# Patient Record
Sex: Male | Born: 1990
Health system: Southern US, Community
[De-identification: ages and names within clinical notes are randomized; demographics above are authoritative.]

## PROBLEM LIST (undated history)

## (undated) DIAGNOSIS — R519 Headache, unspecified: Secondary | ICD-10-CM

## (undated) DIAGNOSIS — L2082 Flexural eczema: Secondary | ICD-10-CM

## (undated) DIAGNOSIS — B20 Human immunodeficiency virus [HIV] disease: Secondary | ICD-10-CM

## (undated) DIAGNOSIS — R21 Rash and other nonspecific skin eruption: Secondary | ICD-10-CM

## (undated) DIAGNOSIS — I10 Essential (primary) hypertension: Secondary | ICD-10-CM

## (undated) DIAGNOSIS — Z973 Presence of spectacles and contact lenses: Secondary | ICD-10-CM

## (undated) DIAGNOSIS — R51 Headache: Secondary | ICD-10-CM

## (undated) DIAGNOSIS — Z21 Asymptomatic human immunodeficiency virus [HIV] infection status: Secondary | ICD-10-CM

## (undated) DIAGNOSIS — L309 Dermatitis, unspecified: Secondary | ICD-10-CM

## (undated) HISTORY — DX: Dermatitis, unspecified: L30.9

## (undated) HISTORY — DX: Essential (primary) hypertension: I10

## (undated) HISTORY — DX: Human immunodeficiency virus (HIV) disease: B20

## (undated) HISTORY — PX: COLONOSCOPY: SHX174

## (undated) HISTORY — PX: WISDOM TOOTH EXTRACTION: SHX21

## (undated) HISTORY — DX: Rash and other nonspecific skin eruption: R21

## (undated) HISTORY — DX: Flexural eczema: L20.82

## (undated) HISTORY — DX: Asymptomatic human immunodeficiency virus (hiv) infection status: Z21

---
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
## 2008-12-20 ENCOUNTER — Encounter: Payer: Self-pay | Admitting: Infectious Diseases

## 2008-12-21 ENCOUNTER — Ambulatory Visit: Payer: Self-pay | Admitting: Internal Medicine

## 2008-12-21 DIAGNOSIS — B20 Human immunodeficiency virus [HIV] disease: Secondary | ICD-10-CM | POA: Insufficient documentation

## 2008-12-21 LAB — CONVERTED CEMR LAB
Alkaline Phosphatase: 58 units/L (ref 39–117)
BUN: 9 mg/dL (ref 6–23)
Basophils Absolute: 0.1 10*3/uL (ref 0.0–0.1)
Basophils Relative: 1 % (ref 0–1)
Bilirubin Urine: NEGATIVE
Chlamydia, Swab/Urine, PCR: NEGATIVE
Cholesterol: 74 mg/dL (ref 0–200)
Eosinophils Absolute: 0.4 10*3/uL (ref 0.0–0.7)
Eosinophils Relative: 5 % (ref 0–5)
Glucose, Bld: 77 mg/dL (ref 70–99)
HCT: 41.3 % (ref 39.0–52.0)
HDL: 29 mg/dL — ABNORMAL LOW (ref >39)
HIV-1 RNA Quant, Log: 5.64 — ABNORMAL HIGH (ref <1.68)
HIV-1 antibody: POSITIVE — AB
HIV-2 Ab: UNDETERMINED — AB
HIV: REACTIVE
Hemoglobin: 13 g/dL (ref 13.0–17.0)
Hep A Total Ab: NEGATIVE
Ketones, ur: NEGATIVE mg/dL
LDL Cholesterol: 36 mg/dL (ref 0–99)
MCHC: 31.5 g/dL (ref 30.0–36.0)
MCV: 84.5 fL (ref >=78.0)
Monocytes Absolute: 0.9 10*3/uL (ref 0.1–1.0)
Neutro Abs: 2.9 10*3/uL (ref 1.7–7.7)
RDW: 14.8 % (ref 11.5–15.5)
RPR Titer: 1:1 {titer}
Specific Gravity, Urine: 1.029 (ref 1.005–1.0)
T pallidum Antibodies (TP-PA): 17 — ABNORMAL HIGH (ref <1.0)
Total Bilirubin: 0.3 mg/dL (ref 0.3–1.2)
Triglycerides: 46 mg/dL (ref <150)
Urine Glucose: NEGATIVE mg/dL
Urobilinogen, UA: 1 (ref 0.0–1.0)
VLDL: 9 mg/dL (ref 0–40)

## 2009-01-05 ENCOUNTER — Encounter: Payer: Self-pay | Admitting: Infectious Disease

## 2009-01-07 ENCOUNTER — Ambulatory Visit: Payer: Self-pay | Admitting: Internal Medicine

## 2009-04-08 ENCOUNTER — Encounter: Payer: Self-pay | Admitting: Internal Medicine

## 2009-10-06 ENCOUNTER — Telehealth: Payer: Self-pay | Admitting: Internal Medicine

## 2009-11-02 ENCOUNTER — Telehealth: Payer: Self-pay | Admitting: Internal Medicine

## 2009-11-28 ENCOUNTER — Telehealth (INDEPENDENT_AMBULATORY_CARE_PROVIDER_SITE_OTHER): Payer: Self-pay | Admitting: *Deleted

## 2010-03-08 NOTE — Progress Notes (Signed)
Summary: ncadap meds arrived for sept-pt aware  Prescriptions: ATRIPLA 600-200-300 MG TABS (EFAVIRENZ-EMTRICITAB-TENOFOVIR) Take 1 tablet by mouth at bedtime  #30 x 0   Entered by:   Paulo Fruit  BS,CPht II,MPH   Authorized by:   Yisroel Ramming MD   Signed by:   Paulo Fruit  BS,CPht II,MPH on 11/02/2009   Method used:   Samples Given   RxID:   [id number]  Patient Assist Medication Verification: Medication name: Neva Seat # [id number] Tech approval:mld Call placed to patient with message that assistance medications are ready for pick-up. Paulo Fruit  BS,CPht II,MPH  November 02, 2009 8:36 AM  Appended Document: ncadap meds arrived for sept-pt aware tried to contact patient.  phone does not work. Need updated phone number.

## 2010-03-08 NOTE — Miscellaneous (Signed)
Summary: RW Update  Clinical Lists Changes  Observations: Added new observation of SYPHILIS TX: Yes (04/08/2009 11:43)

## 2010-03-08 NOTE — Progress Notes (Signed)
Summary: ADAP meds arrived, unable to notify pt., no working phone number  Phone Note Outgoing Call   Call placed by: Annice Pih Summary of Call: Tried to notify pt. that ADAP meds had arrived, but no working phone number for pt. Initial call taken by: Wendall Mola CMA Duncan Dull),  November 28, 2009 12:29 PM     Appended Document: ADAP meds arrived, unable to notify pt., no working phone number Pt picked up Atripla samples from ADAP. Tammy K King,RN III

## 2010-03-08 NOTE — Progress Notes (Signed)
Summary: ncadap med arrived for Aug--pt picked up  Phone Note Refill Request      Prescriptions: ATRIPLA 600-200-300 MG TABS (EFAVIRENZ-EMTRICITAB-TENOFOVIR) Take 1 tablet by mouth at bedtime  #30 x 0   Entered by:   Paulo Fruit  BS,CPht II,MPH   Authorized by:   Yisroel Ramming MD   Signed by:   Paulo Fruit  BS,CPht II,MPH on 10/06/2009   Method used:   Samples Given   RxID:   [id number]  Patient Assist Medication Verification: Medication name: Tyrone Nine RX # [id number] Tech approval:MLD  Prescription/Samples picked up by: patient Paulo Fruit  BS,CPht II,MPH  October 06, 2009 2:15 PM

## 2010-03-08 NOTE — Consult Note (Signed)
Summary: New Referral: [facility] Dept.  New Referral: [facility] Dept.   Imported By: Florinda Marker 02/23/2009 15:18:17  _____________________________________________________________________  External Attachment:    Type:   Image     Comment:   External Document

## 2010-03-25 ENCOUNTER — Encounter (INDEPENDENT_AMBULATORY_CARE_PROVIDER_SITE_OTHER): Payer: Self-pay | Admitting: *Deleted

## 2010-03-29 NOTE — Miscellaneous (Signed)
Summary: RW/ADAP Update  Clinical Lists Changes  Observations: Added new observation of FINASSESSDT: 09/21/2009 (03/25/2010 11:22) Added new observation of PAYOR: No Insurance (03/25/2010 11:22) Added new observation of AIDSDAP: Yes 2011 (03/25/2010 11:22) Added new observation of PCTFPL: 66.48  (03/25/2010 11:22) Added new observation of INCOMESOURCE: wages  (03/25/2010 11:22) Added new observation of HOUSEINCOME: 7200  (03/25/2010 11:22) Added new observation of #CHILD<18 IN: No  (03/25/2010 11:22) Added new observation of FAMILYSIZE: 1  (03/25/2010 11:22) Added new observation of HOUSING: Stable/permanent  (03/25/2010 11:22) Added new observation of YEARLYEXPEN: 0  (03/25/2010 11:22)

## 2010-04-05 ENCOUNTER — Ambulatory Visit (INDEPENDENT_AMBULATORY_CARE_PROVIDER_SITE_OTHER): Payer: Self-pay

## 2010-04-05 ENCOUNTER — Other Ambulatory Visit: Payer: Self-pay | Admitting: Adult Health

## 2010-04-05 ENCOUNTER — Encounter: Payer: Self-pay | Admitting: Adult Health

## 2010-04-05 DIAGNOSIS — B20 Human immunodeficiency virus [HIV] disease: Secondary | ICD-10-CM

## 2010-04-05 LAB — CONVERTED CEMR LAB
ALT: 15 units/L (ref 0–53)
AST: 16 units/L (ref 0–37)
Albumin: 4.5 g/dL (ref 3.5–5.2)
Bacteria, UA: NONE SEEN
Basophils Absolute: 0 10*3/uL (ref 0.0–0.1)
Basophils Relative: 0 % (ref 0–1)
Bilirubin Urine: NEGATIVE
Blood, UA: NEGATIVE
Calcium: 8.9 mg/dL (ref 8.4–10.5)
Chlamydia, Swab/Urine, PCR: NEGATIVE
Chloride: 97 meq/L (ref 96–112)
Crystals: NONE SEEN
Eosinophils Relative: 0 % (ref 0–5)
HCT: 39.8 % (ref 39.0–52.0)
Lymphocytes Relative: 11 % — ABNORMAL LOW (ref 12–46)
Platelets: 165 10*3/uL (ref 150–400)
RDW: 13 % (ref 11.5–15.5)
Sodium: 133 meq/L — ABNORMAL LOW (ref 135–145)
Specific Gravity, Urine: 1.02 (ref 1.005–1.030)
Urobilinogen, UA: 0.2 (ref 0.0–1.0)

## 2010-04-06 LAB — T-HELPER CELL (CD4) - (RCID CLINIC ONLY)
CD4 % Helper T Cell: 18 % — ABNORMAL LOW (ref 33–55)
CD4 T Cell Abs: 460 uL (ref 400–2700)

## 2010-04-07 ENCOUNTER — Emergency Department (HOSPITAL_COMMUNITY)
Admission: EM | Admit: 2010-04-07 | Discharge: 2010-04-07 | Disposition: A | Discharge status: Home / Self Care | Payer: Self-pay | Attending: Emergency Medicine | Admitting: Emergency Medicine

## 2010-04-07 DIAGNOSIS — K612 Anorectal abscess: Secondary | ICD-10-CM | POA: Insufficient documentation

## 2010-04-07 DIAGNOSIS — Z79899 Other long term (current) drug therapy: Secondary | ICD-10-CM | POA: Insufficient documentation

## 2010-04-07 DIAGNOSIS — R350 Frequency of micturition: Secondary | ICD-10-CM | POA: Insufficient documentation

## 2010-04-07 DIAGNOSIS — R197 Diarrhea, unspecified: Secondary | ICD-10-CM | POA: Insufficient documentation

## 2010-04-07 DIAGNOSIS — K6289 Other specified diseases of anus and rectum: Secondary | ICD-10-CM | POA: Insufficient documentation

## 2010-04-07 DIAGNOSIS — Z21 Asymptomatic human immunodeficiency virus [HIV] infection status: Secondary | ICD-10-CM | POA: Insufficient documentation

## 2010-04-07 LAB — URINALYSIS, ROUTINE W REFLEX MICROSCOPIC
Ketones, ur: 40 mg/dL — AB
Nitrite: NEGATIVE
Protein, ur: 100 mg/dL — AB
Specific Gravity, Urine: 1.026 (ref 1.005–1.030)
Urobilinogen, UA: 1 mg/dL (ref 0.0–1.0)

## 2010-04-07 LAB — URINE MICROSCOPIC-ADD ON

## 2010-04-08 LAB — URINE CULTURE
Colony Count: NO GROWTH
Culture  Setup Time: 201203021100

## 2010-04-18 NOTE — Assessment & Plan Note (Signed)
Summary: LABS   Allergies: No Known Drug Allergies

## 2010-04-20 ENCOUNTER — Ambulatory Visit: Payer: Self-pay | Admitting: Adult Health

## 2010-04-20 ENCOUNTER — Encounter: Payer: Self-pay | Admitting: Adult Health

## 2010-04-21 ENCOUNTER — Encounter (INDEPENDENT_AMBULATORY_CARE_PROVIDER_SITE_OTHER): Payer: Self-pay | Admitting: *Deleted

## 2010-04-25 NOTE — Miscellaneous (Signed)
Summary: ADAP UPDATE   Clinical Lists Changes  Observations: Added new observation of AIDSDAP: Pending-APPROVAL 2012 (04/21/2010 10:26) Added new observation of PCTFPL: 36.91  (04/21/2010 10:26) Added new observation of HOUSEINCOME: 3997  (04/21/2010 10:26) Added new observation of FINASSESSDT: 04/20/2010  (04/21/2010 10:26)

## 2010-05-01 ENCOUNTER — Encounter: Payer: Self-pay | Admitting: Adult Health

## 2010-05-01 ENCOUNTER — Ambulatory Visit (INDEPENDENT_AMBULATORY_CARE_PROVIDER_SITE_OTHER): Payer: Self-pay | Admitting: Adult Health

## 2010-05-01 VITALS — BP 108/72 | HR 128 | Temp 102.5°F | Ht 67.0 in | Wt 144.0 lb

## 2010-05-01 DIAGNOSIS — R509 Fever, unspecified: Secondary | ICD-10-CM

## 2010-05-01 DIAGNOSIS — K612 Anorectal abscess: Secondary | ICD-10-CM

## 2010-05-01 MED ORDER — DOXYCYCLINE HYCLATE 100 MG PO CAPS: 100.0000 mg | ORAL_CAPSULE | Freq: Two times a day (BID) | ORAL | Status: AC

## 2010-05-01 MED ORDER — CIPROFLOXACIN HCL 500 MG PO TABS: 500.0000 mg | ORAL_TABLET | Freq: Two times a day (BID) | ORAL | Status: AC

## 2010-05-01 MED ORDER — HYDROCODONE-IBUPROFEN 7.5-200 MG PO TABS: 1.0000 | ORAL_TABLET | ORAL | Status: AC | PRN

## 2010-05-01 NOTE — Progress Notes (Signed)
  Subjective:    Patient ID: Patrick Reilly, male    DOB: 1990-04-08, 20 y.o.   MRN: [id number]  HPI Pain, swelling, redness and fever developed to right buttock since Saturday 03/24.  Pain increasing with fevers over the past 24 hours.  Unable to sit due to pain    Review of Systems  Constitutional: Positive for fever, chills, activity change and fatigue. Negative for diaphoresis, appetite change and unexpected weight change.  HENT: Negative.   Eyes: Negative.   Respiratory: Negative.   Cardiovascular: Negative.   Gastrointestinal: Positive for constipation and rectal pain. Negative for vomiting, abdominal pain, diarrhea, blood in stool, abdominal distention and anal bleeding.  Genitourinary: Negative.   Musculoskeletal: Negative.   Skin: Negative.   Neurological: Negative.   Hematological: Negative.   Psychiatric/Behavioral: Negative.        Objective:   Physical Exam  Constitutional: He appears well-developed and well-nourished. He appears distressed.  HENT:  Head: Normocephalic and atraumatic.  Eyes: Conjunctivae and EOM are normal. Pupils are equal, round, and reactive to light.  Neck: Normal range of motion. Neck supple.  Cardiovascular: Regular rhythm and normal heart sounds.  Tachycardia present.   Pulmonary/Chest: Effort normal and breath sounds normal.  Abdominal: He exhibits no distension and no mass. There is no tenderness. There is no rebound and no guarding.  Skin: He is not diaphoretic.          Large, inflamed, fluctuant tender mass-effect to superior portion of right gluteal fold. No head formation noted.          Assessment & Plan:  Peri-rectal Abscess:  I&D of abscess, Cipro & Doxycycline antibiotic coverage for 10 days, Stool softner, Vicoprofen 7.5/200 q 4-6 h PRN, Dressing change bid.   RTC Wednesday for wound eval.   PROCEDURE NOTE:  Procedure explained in detail to pt. With discussion of risks and benefits outlined.  Area was prepped in medically  aseptic technique using sterile Betadine and sterile NS rinse,  A small 2 cm-sized area was anesthetized using 1% lidocaine, 25 gauge needle to area just adjacent to fluctuant abscessed region of right superior gluteal fold.  Using #15 scalpel a 0.5 cm incision was at site of infiltration.  Overt amount of sero-sanguinous fluid drained from wound, but no purulent matter was noted even after compressing wound.  Procedure was stopped due to patient discomfort.  The area was again cleansed with sterile Betadine and packed with sterile 4x4 gauze pads and ABD pad.  He was instructed to change dressing at least twice daily, and after bathing.    Instructed on diluted Betadine sitz baths 2-3 x/day, should remain off work until he is re-examined on Wednesday.  If wound does not spontaneously drain by then, we will refer to [facility] for further evaluation and intervention.  Verbally acknowledged this and agreed with plan.

## 2010-05-02 ENCOUNTER — Telehealth: Payer: Self-pay | Admitting: *Deleted

## 2010-05-02 MED ORDER — ACETAMINOPHEN 325 MG PO TABS: 650.0000 mg | ORAL_TABLET | ORAL | Status: AC | PRN

## 2010-05-02 NOTE — Telephone Encounter (Signed)
Pam from [employer] called [phone]) asking about his out of work note. The one she was given has the letterhead ripped off & did not give a specific date to return. Explained he is coming in tomorrow & will get another note. She asked that it be intact so that it includes letterhead.   Must have specific dates on it

## 2010-05-03 ENCOUNTER — Ambulatory Visit (INDEPENDENT_AMBULATORY_CARE_PROVIDER_SITE_OTHER): Payer: Self-pay | Admitting: Adult Health

## 2010-05-03 VITALS — BP 117/77 | HR 93 | Temp 98.1°F | Ht 67.0 in | Wt 139.8 lb

## 2010-05-03 DIAGNOSIS — B20 Human immunodeficiency virus [HIV] disease: Secondary | ICD-10-CM

## 2010-05-04 NOTE — Assessment & Plan Note (Signed)
Summary: 2wk f/u/vs   Vital Signs:  Patient profile:   20 year old male Height:      67 inches (170.18 cm) Weight:      148.0 pounds (67.27 kg) BMI:     23.26 Temp:     97.9 degrees F (36.61 degrees C) oral Pulse rate:   78 / minute BP sitting:   144 / 90  (right arm)  Vitals Entered By: Wendall Mola CMA Duncan Dull) (April 20, 2010 10:29 AM) CC: new pt., lab results Is Patient Diabetic? No Pain Assessment Patient in pain? no      Nutritional Status BMI of 19 -24 = normal Nutritional Status Detail appetite "good"  Have you ever been in a relationship where you felt threatened, hurt or afraid?No   Does patient need assistance? Functional Status Self care Ambulation Normal Comments pt. missed about 3 doses of Atripla    CC:  new pt. and lab results.  Preventive Screening-Counseling & Management  Alcohol-Tobacco     Alcohol drinks/day: 0     Smoking Status: quit < 6 months     Packs/Day: <0.25  Caffeine-Diet-Exercise     Caffeine use/day: sodas sometimes     Does Patient Exercise: yes     Type of exercise: dancer     Exercise (avg: min/session): >60     Times/week: <3  Safety-Violence-Falls     Seat Belt Use: yes      Drug Use:  Yes marijuana.    Comments: pt. declined condoms  Allergies (verified): No Known Drug Allergies  Social History: Drug Use:  Yes marijuana   Other Orders: Influenza Vaccine NON MCR (16109)   Orders Added: 1)  Influenza Vaccine NON MCR [00028]   Immunizations Administered:  Influenza Vaccine # 1:    Vaccine Type: Fluvax Non-MCR    Site: left deltoid    Mfr: Merck    Dose: 0.5 ml    Route: IM    Given by: Wendall Mola CMA ( AAMA)    Exp. Date: 08/05/2010    Lot #: [id number]    VIS given: 08/30/09 version given April 20, 2010.  Flu Vaccine Consent Questions:    Do you have a history of severe allergic reactions to this vaccine? no    Any prior history of allergic reactions to egg and/or gelatin? no  Do you have a sensitivity to the preservative Thimersol? no    Do you have a past history of Guillan-Barre Syndrome? no    Do you currently have an acute febrile illness? no    Have you ever had a severe reaction to latex? no    Vaccine information given and explained to patient? yes   Immunizations Administered:  Influenza Vaccine # 1:    Vaccine Type: Fluvax Non-MCR    Site: left deltoid    Mfr: Merck    Dose: 0.5 ml    Route: IM    Given by: Wendall Mola CMA ( AAMA)    Exp. Date: 08/05/2010    Lot #: [id number]    VIS given: 08/30/09 version given April 20, 2010.

## 2010-05-09 NOTE — Letter (Signed)
Summary: Out of Work  [facility]  [address]   [place], [place] [address]   Phone: [phone]  Fax: [phone]    May 01, 2010   Employee:  Patrick Reilly    To Whom It May Concern:   For Medical reasons, please excuse the above named employee from work for the following dates:  Start:    End:    If you need additional information, please feel free to contact our office.         Sincerely,    Alesia Morin CMA

## 2010-05-09 NOTE — Letter (Signed)
Summary: Out of Work  [facility]  [address]   [place], [place] [address]   Phone: [phone]  Fax: [phone]        May 03, 2010   Employee:  Patrick Reilly    To Whom It May Concern:   For Medical reasons, please excuse the above named employee from work for the following dates:  Start:   Monday May 01, 2010  End:   Wednesday May 03, 2010  Patient is able to return to work on Thursday, March 29 to full duty. If you need additional information, please feel free to contact our office.         Sincerely,    Talmadge Chad, N.P. Alesia Morin CMA

## 2010-05-10 LAB — T-HELPER CELL (CD4) - (RCID CLINIC ONLY): CD4 T Cell Abs: 480 uL (ref 400–2700)

## 2010-07-13 ENCOUNTER — Other Ambulatory Visit: Payer: Self-pay | Admitting: Adult Health

## 2010-07-13 ENCOUNTER — Other Ambulatory Visit: Payer: Self-pay

## 2010-07-13 DIAGNOSIS — Z113 Encounter for screening for infections with a predominantly sexual mode of transmission: Secondary | ICD-10-CM

## 2010-07-13 DIAGNOSIS — B20 Human immunodeficiency virus [HIV] disease: Secondary | ICD-10-CM

## 2010-07-13 DIAGNOSIS — Z79899 Other long term (current) drug therapy: Secondary | ICD-10-CM

## 2010-07-27 ENCOUNTER — Ambulatory Visit: Payer: Self-pay | Admitting: Adult Health

## 2010-09-04 ENCOUNTER — Other Ambulatory Visit: Payer: Self-pay | Admitting: Licensed Clinical Social Worker

## 2010-09-04 DIAGNOSIS — B20 Human immunodeficiency virus [HIV] disease: Secondary | ICD-10-CM

## 2010-09-04 MED ORDER — EFAVIRENZ-EMTRICITAB-TENOFOVIR 600-200-300 MG PO TABS: 1.0000 | ORAL_TABLET | Freq: Every day | ORAL | Status: DC

## 2010-11-14 LAB — CBC
HCT: 46.5
Hemoglobin: 15.5
RBC: 5.83 — ABNORMAL HIGH
RDW: 13.7

## 2010-11-14 LAB — URINALYSIS, ROUTINE W REFLEX MICROSCOPIC
Bilirubin Urine: NEGATIVE
Leukocytes, UA: NEGATIVE
Nitrite: NEGATIVE
Specific Gravity, Urine: 1.031 — ABNORMAL HIGH
Urobilinogen, UA: 0.2
pH: 5.5

## 2010-11-14 LAB — DIFFERENTIAL
Basophils Absolute: 0
Basophils Relative: 0
Eosinophils Absolute: 0
Neutro Abs: 5.6
Neutrophils Relative %: 84 — ABNORMAL HIGH

## 2010-11-14 LAB — COMPREHENSIVE METABOLIC PANEL
ALT: 16
Alkaline Phosphatase: 49
BUN: 15
CO2: 25
GFR calc non Af Amer: >60
Glucose, Bld: 109 — ABNORMAL HIGH
Potassium: 3.5
Sodium: 133 — ABNORMAL LOW
Total Bilirubin: 1

## 2010-11-14 LAB — STOOL CULTURE

## 2010-11-14 LAB — CLOSTRIDIUM DIFFICILE EIA: C difficile Toxins A+B, EIA: NEGATIVE

## 2010-11-14 LAB — OVA AND PARASITE EXAMINATION

## 2010-11-14 LAB — URINE MICROSCOPIC-ADD ON

## 2010-11-29 ENCOUNTER — Other Ambulatory Visit (INDEPENDENT_AMBULATORY_CARE_PROVIDER_SITE_OTHER): Payer: Self-pay

## 2010-11-29 ENCOUNTER — Other Ambulatory Visit: Payer: Self-pay | Admitting: *Deleted

## 2010-11-29 ENCOUNTER — Other Ambulatory Visit: Payer: Self-pay | Admitting: Infectious Diseases

## 2010-11-29 ENCOUNTER — Ambulatory Visit: Payer: Self-pay

## 2010-11-29 DIAGNOSIS — B20 Human immunodeficiency virus [HIV] disease: Secondary | ICD-10-CM

## 2010-11-29 DIAGNOSIS — Z113 Encounter for screening for infections with a predominantly sexual mode of transmission: Secondary | ICD-10-CM

## 2010-11-29 DIAGNOSIS — Z79899 Other long term (current) drug therapy: Secondary | ICD-10-CM

## 2010-11-29 LAB — CBC WITH DIFFERENTIAL/PLATELET
Eosinophils Absolute: 0.1 10*3/uL (ref 0.0–0.7)
Hemoglobin: 12.7 g/dL — ABNORMAL LOW (ref 13.0–17.0)
Lymphocytes Relative: 63 % — ABNORMAL HIGH (ref 12–46)
Lymphs Abs: 2.9 10*3/uL (ref 0.7–4.0)
MCH: 26.6 pg (ref 26.0–34.0)
Neutro Abs: 1.2 10*3/uL — ABNORMAL LOW (ref 1.7–7.7)
Neutrophils Relative %: 26 % — ABNORMAL LOW (ref 43–77)
Platelets: 207 10*3/uL (ref 150–400)
RBC: 4.77 MIL/uL (ref 4.22–5.81)
WBC: 4.5 10*3/uL (ref 4.0–10.5)

## 2010-11-29 LAB — COMPLETE METABOLIC PANEL WITH GFR
ALT: 12 U/L (ref 0–53)
Albumin: 4.3 g/dL (ref 3.5–5.2)
CO2: 25 mEq/L (ref 19–32)
GFR, Est African American: >90 mL/min (ref >90)
Potassium: 4.4 mEq/L (ref 3.5–5.3)
Sodium: 139 mEq/L (ref 135–145)
Total Bilirubin: 0.3 mg/dL (ref 0.3–1.2)
Total Protein: 7.6 g/dL (ref 6.0–8.3)

## 2010-11-29 LAB — URINALYSIS, MICROSCOPIC ONLY
Casts: NONE SEEN
Crystals: NONE SEEN
Squamous Epithelial / LPF: NONE SEEN

## 2010-11-29 LAB — URINALYSIS, ROUTINE W REFLEX MICROSCOPIC
Bilirubin Urine: NEGATIVE
Ketones, ur: NEGATIVE mg/dL
Specific Gravity, Urine: 1.015 (ref 1.005–1.030)
pH: 7.5 (ref 5.0–8.0)

## 2010-11-29 LAB — LIPID PANEL
Cholesterol: 113 mg/dL (ref 0–200)
Total CHOL/HDL Ratio: 2.1 Ratio
Triglycerides: 43 mg/dL (ref <150)
VLDL: 9 mg/dL (ref 0–40)

## 2010-11-29 MED ORDER — EFAVIRENZ-EMTRICITAB-TENOFOVIR 600-200-300 MG PO TABS: 1.0000 | ORAL_TABLET | Freq: Every day | ORAL | Status: DC

## 2010-11-29 NOTE — Telephone Encounter (Signed)
Patient needs Rx to go with ADAP application.

## 2010-11-30 LAB — T-HELPER CELL (CD4) - (RCID CLINIC ONLY): CD4 % Helper T Cell: 22 % — ABNORMAL LOW (ref 33–55)

## 2010-12-20 ENCOUNTER — Ambulatory Visit: Payer: Self-pay | Admitting: Infectious Disease

## 2011-03-08 NOTE — Assessment & Plan Note (Addendum)
Clinically stable on current regimen. Continue present management.  Counseling provided on prevention of transmission of HIV. Condoms offered:  Yes Medication adherence discussed with patient.  Follow up visit in 6 months with labs 2 weeks prior to appointment. Patient acknowledged information provided to them and agreed with plan of care.

## 2011-03-08 NOTE — Progress Notes (Signed)
Subjective:    Patient ID: Patrick Reilly is a 21 y.o. male.  Chief Complaint: HIV Follow-up Visit Patrick Reilly is here for follow-up of HIV infection. He is feeling unchanged since his last visit.  He claims continued adherence to therapy with good tolerance and no complications. There are not additional complaints.   Data Review: Diagnostic studies reviewed.  Review of Systems - General ROS: negative for - chills, fatigue, fever, hot flashes or malaise Psychological ROS: negative for - anxiety, behavioral disorder, decreased libido, depression or mood swings Ophthalmic ROS: negative for - blurry vision, decreased vision or double vision Respiratory ROS: no cough, shortness of breath, or wheezing Cardiovascular ROS: no chest pain or dyspnea on exertion Gastrointestinal ROS: no abdominal pain, change in bowel habits, or black or bloody stools Neurological ROS: no TIA or stroke symptoms Dermatological ROS: negative for rash and skin lesion changes  Objective:  General appearance: alert, cooperative, appears stated age and no distress Head: Normocephalic, without obvious abnormality, atraumatic Neck: no adenopathy, no carotid bruit, no JVD, supple, symmetrical, trachea midline and thyroid not enlarged, symmetric, no tenderness/mass/nodules Resp: clear to auscultation bilaterally Cardio: regular rate and rhythm, S1, S2 normal, no murmur, click, rub or gallop GI: soft, non-tender; bowel sounds normal; no masses,  no organomegaly Extremities: extremities normal, atraumatic, no cyanosis or edema Neurologic: Alert and oriented X 3, normal strength and tone. Normal symmetric reflexes. Normal coordination and gait Skin:  No active lesions or rashes noted.    Psych:  No vegetative signs or delusional behaviors noted.    Laboratory: From 04/05/10 ,  CD4 count was 460 c/cmm @ 18%. Viral load <20 copies/ml.     Assessment/Plan:   Human immunodeficiency virus (HIV) disease Clinically stable  on current regimen. Continue present management.  Counseling provided on prevention of transmission of HIV. Condoms offered:  Yes Medication adherence discussed with patient.  Follow up visit in 3 months with labs 2 weeks prior to appointment. Patient acknowledged information provided to them and agreed with plan of care.        Patrick Reilly A. Sundra Aland, MS, [facility] [phone]  03/08/2011, 3:46 PM

## 2011-04-18 ENCOUNTER — Other Ambulatory Visit (INDEPENDENT_AMBULATORY_CARE_PROVIDER_SITE_OTHER): Payer: Self-pay

## 2011-04-18 DIAGNOSIS — Z79899 Other long term (current) drug therapy: Secondary | ICD-10-CM

## 2011-04-18 DIAGNOSIS — B20 Human immunodeficiency virus [HIV] disease: Secondary | ICD-10-CM

## 2011-04-18 DIAGNOSIS — Z113 Encounter for screening for infections with a predominantly sexual mode of transmission: Secondary | ICD-10-CM

## 2011-04-18 LAB — CBC WITH DIFFERENTIAL/PLATELET
Basophils Absolute: 0 10*3/uL (ref 0.0–0.1)
Eosinophils Absolute: 0.1 10*3/uL (ref 0.0–0.7)
Eosinophils Relative: 2 % (ref 0–5)
Lymphs Abs: 3.3 10*3/uL (ref 0.7–4.0)
MCH: 27.1 pg (ref 26.0–34.0)
MCHC: 32.5 g/dL (ref 30.0–36.0)
MCV: 83.5 fL (ref 78.0–100.0)
Platelets: 204 10*3/uL (ref 150–400)
RDW: 14.7 % (ref 11.5–15.5)

## 2011-04-18 LAB — COMPLETE METABOLIC PANEL WITH GFR
ALT: 17 U/L (ref 0–53)
CO2: 26 mEq/L (ref 19–32)
Creat: 0.91 mg/dL (ref 0.50–1.35)
GFR, Est African American: >89 mL/min
Total Bilirubin: 0.2 mg/dL — ABNORMAL LOW (ref 0.3–1.2)

## 2011-04-18 LAB — URINALYSIS, ROUTINE W REFLEX MICROSCOPIC
Leukocytes, UA: NEGATIVE
Nitrite: NEGATIVE
Specific Gravity, Urine: 1.029 (ref 1.005–1.030)
Urobilinogen, UA: 1 mg/dL (ref 0.0–1.0)

## 2011-04-18 LAB — LIPID PANEL
Cholesterol: 103 mg/dL (ref 0–200)
Total CHOL/HDL Ratio: 2.4 Ratio

## 2011-04-19 LAB — T-HELPER CELL (CD4) - (RCID CLINIC ONLY)
CD4 % Helper T Cell: 22 % — ABNORMAL LOW (ref 33–55)
CD4 T Cell Abs: 720 uL (ref 400–2700)

## 2011-04-19 LAB — GC/CHLAMYDIA PROBE AMP, URINE
Chlamydia, Swab/Urine, PCR: NEGATIVE
GC Probe Amp, Urine: NEGATIVE

## 2011-04-20 LAB — HIV-1 RNA QUANT-NO REFLEX-BLD: HIV-1 RNA Quant, Log: <1.3 {Log} (ref <1.30)

## 2011-05-01 ENCOUNTER — Telehealth: Payer: Self-pay

## 2011-05-01 NOTE — Telephone Encounter (Signed)
Several attempts made by Patrick Reilly at [facility] to renew patient's adap/rw - has no shown or not gotten back.

## 2011-05-02 ENCOUNTER — Encounter: Payer: Self-pay | Admitting: Infectious Disease

## 2011-05-02 ENCOUNTER — Ambulatory Visit (INDEPENDENT_AMBULATORY_CARE_PROVIDER_SITE_OTHER): Payer: Self-pay | Admitting: Infectious Disease

## 2011-05-02 VITALS — BP 138/73 | HR 76 | Temp 98.2°F | Wt 164.0 lb

## 2011-05-02 DIAGNOSIS — Z23 Encounter for immunization: Secondary | ICD-10-CM

## 2011-05-02 DIAGNOSIS — F1290 Cannabis use, unspecified, uncomplicated: Secondary | ICD-10-CM | POA: Insufficient documentation

## 2011-05-02 DIAGNOSIS — B20 Human immunodeficiency virus [HIV] disease: Secondary | ICD-10-CM

## 2011-05-02 DIAGNOSIS — F329 Major depressive disorder, single episode, unspecified: Secondary | ICD-10-CM

## 2011-05-02 DIAGNOSIS — F1210 Cannabis abuse, uncomplicated: Secondary | ICD-10-CM

## 2011-05-02 DIAGNOSIS — Z7289 Other problems related to lifestyle: Secondary | ICD-10-CM

## 2011-05-02 NOTE — Assessment & Plan Note (Signed)
No need for intervention.

## 2011-05-02 NOTE — Progress Notes (Signed)
  Subjective:    Patient ID: Patrick Reilly, male    DOB: 03-01-1990, 21 y.o.   MRN: [id number]  HPI  21 year old [place] american man  With HIV superbly well controlled on atripla although he is missing 2 doses of it a week when he drinks etoh. I emphasized to pt low barrier to genetic resistance of this drug and need to tighten up his adherence. He is othwerwise doing relatively well with occasional depression, but not feeling need for SSRI or counselling. He does smoke marijuana but no tobacco.   Review of Systems  Constitutional: Negative for fever, chills, diaphoresis, activity change, appetite change, fatigue and unexpected weight change.  HENT: Negative for congestion, sore throat, rhinorrhea, sneezing, trouble swallowing and sinus pressure.   Eyes: Negative for photophobia and visual disturbance.  Respiratory: Negative for cough, chest tightness, shortness of breath, wheezing and stridor.   Cardiovascular: Negative for chest pain, palpitations and leg swelling.  Gastrointestinal: Negative for nausea, vomiting, abdominal pain, diarrhea, constipation, blood in stool, abdominal distention and anal bleeding.  Genitourinary: Negative for dysuria, hematuria, flank pain and difficulty urinating.  Musculoskeletal: Negative for myalgias, back pain, joint swelling, arthralgias and gait problem.  Skin: Negative for color change, pallor, rash and wound.  Neurological: Negative for dizziness, tremors, weakness and light-headedness.  Hematological: Negative for adenopathy. Does not bruise/bleed easily.  Psychiatric/Behavioral: Negative for behavioral problems, confusion, sleep disturbance, dysphoric mood, decreased concentration and agitation.       Objective:   Physical Exam  Constitutional: He is oriented to person, place, and time. He appears well-developed and well-nourished. No distress.  HENT:  Head: Normocephalic and atraumatic.  Mouth/Throat: Oropharynx is clear and moist. No oropharyngeal  exudate.  Eyes: Conjunctivae and EOM are normal. Pupils are equal, round, and reactive to light. No scleral icterus.  Neck: Normal range of motion. Neck supple. No JVD present.  Cardiovascular: Normal rate, regular rhythm and normal heart sounds.  Exam reveals no gallop and no friction rub.   No murmur heard. Pulmonary/Chest: Effort normal and breath sounds normal. No respiratory distress. He has no wheezes. He has no rales. He exhibits no tenderness.  Abdominal: He exhibits no distension and no mass. There is no tenderness. There is no rebound and no guarding.  Musculoskeletal: He exhibits no edema and no tenderness.  Lymphadenopathy:    He has no cervical adenopathy.  Neurological: He is alert and oriented to person, place, and time. He has normal reflexes. He exhibits normal muscle tone. Coordination normal.  Skin: Skin is warm and dry. He is not diaphoretic. No erythema. No pallor.  Psychiatric: He has a normal mood and affect. His behavior is normal. Judgment and thought content normal.          Assessment & Plan:  Human immunodeficiency virus (HIV) disease Perfect control but tighten up adherence. Hep A vax since MSM  Depression No need for intervention  Marijuana use Not interfering with adherence of function. No great concerns at this time  Alcohol use Largely on weekends and small quantities. Asked to PLEASE contineu to take atripla on weekends too. Only issue iss if he becomes dehydrated over more than one day then concern for TNF toxicity risk

## 2011-05-02 NOTE — Assessment & Plan Note (Signed)
Perfect control but tighten up adherence. Hep A vax since MSM

## 2011-05-02 NOTE — Assessment & Plan Note (Signed)
Not interfering with adherence of function. No great concerns at this time

## 2011-05-02 NOTE — Progress Notes (Signed)
Addended by: Starleen Arms D on: 05/02/2011 11:22 AM   Modules accepted: Orders

## 2011-05-02 NOTE — Assessment & Plan Note (Signed)
Largely on weekends and small quantities. Asked to PLEASE contineu to take atripla on weekends too. Only issue iss if he becomes dehydrated over more than one day then concern for TNF toxicity risk

## 2011-06-04 ENCOUNTER — Other Ambulatory Visit: Payer: Self-pay | Admitting: Infectious Diseases

## 2011-06-04 ENCOUNTER — Encounter: Payer: Self-pay | Admitting: *Deleted

## 2011-06-04 ENCOUNTER — Other Ambulatory Visit: Payer: Self-pay | Admitting: *Deleted

## 2011-06-04 ENCOUNTER — Encounter: Payer: Self-pay | Admitting: Infectious Diseases

## 2011-06-04 ENCOUNTER — Ambulatory Visit (INDEPENDENT_AMBULATORY_CARE_PROVIDER_SITE_OTHER): Payer: Self-pay | Admitting: Infectious Diseases

## 2011-06-04 VITALS — BP 135/79 | HR 137 | Temp 98.8°F | Ht 66.0 in | Wt 158.4 lb

## 2011-06-04 DIAGNOSIS — L0231 Cutaneous abscess of buttock: Secondary | ICD-10-CM

## 2011-06-04 DIAGNOSIS — A630 Anogenital (venereal) warts: Secondary | ICD-10-CM | POA: Insufficient documentation

## 2011-06-04 DIAGNOSIS — B20 Human immunodeficiency virus [HIV] disease: Secondary | ICD-10-CM

## 2011-06-04 MED ORDER — HYDROCODONE-ACETAMINOPHEN 5-500 MG PO TABS: 1.0000 | ORAL_TABLET | Freq: Four times a day (QID) | ORAL | Status: AC | PRN

## 2011-06-04 MED ORDER — SULFAMETHOXAZOLE-TRIMETHOPRIM 400-80 MG PO TABS: 1.0000 | ORAL_TABLET | Freq: Two times a day (BID) | ORAL | Status: DC

## 2011-06-04 MED ORDER — SULFAMETHOXAZOLE-TRIMETHOPRIM 400-80 MG PO TABS: 1.0000 | ORAL_TABLET | Freq: Two times a day (BID) | ORAL | Status: AC

## 2011-06-04 MED ORDER — EFAVIRENZ-EMTRICITAB-TENOFOVIR 600-200-300 MG PO TABS: 1.0000 | ORAL_TABLET | Freq: Every day | ORAL | Status: DC

## 2011-06-04 NOTE — Progress Notes (Signed)
  Subjective:    Patient ID: Bobie Kistler, male    DOB: 1990/09/25, 21 y.o.   MRN: [id number]  HPI 21 yo M with HIV+, doing well on [place]. Occas missed doses on weekends (ETOH and marijuana use).   For 3 days has had L buttock cyst, has been throwing up as well and has not been able to sleep. Has been having sweats as well. Had previous cyst on his buttocks 04-2010 lanced by NP Sundra Aland.   HIV 1 RNA Quant (copies/mL)  Date Value  04/18/2011 <20   11/29/2010 <20   04/05/2010 <20 copies/mL      CD4 T Cell Abs (cmm)  Date Value  04/18/2011 720   11/29/2010 660   04/05/2010 460       Review of Systems     Objective:   Physical Exam  Constitutional: He appears well-developed and well-nourished.  Genitourinary:             Assessment & Plan:

## 2011-06-04 NOTE — Assessment & Plan Note (Addendum)
States he is taking his ART well, will get refilled today. Will f/u as previously scheduled.

## 2011-06-04 NOTE — Assessment & Plan Note (Addendum)
He states he can take pills. Will start him on batrim bid and ask surgery to see him. Vicodin for pain. Will see him back in 1-2 weeks.

## 2011-06-05 ENCOUNTER — Ambulatory Visit: Payer: Self-pay

## 2011-06-05 ENCOUNTER — Telehealth: Payer: Self-pay

## 2011-06-05 ENCOUNTER — Telehealth: Payer: Self-pay | Admitting: *Deleted

## 2011-06-05 NOTE — Telephone Encounter (Signed)
Patient was scheduled for orange card today, 06/05/11, for necessary dr appt and did not show.

## 2011-06-05 NOTE — Telephone Encounter (Signed)
Patient was to see Britta Mccreedy today about his [employer]. He did not show nor did he call to reschedule. Advised patient on yesterday several times that he needed to get the information in and obtain the card as we can not make his appt at [facility] without it. He expressed understanding but he still did not show. Called the patient and he did not answer the call. The provider wanted him seen within 24 hours and I am not able to do so without the patient doing his part. Patient was told this in plain terms. Will have to wait for the patient to call back or come back.

## 2011-06-14 ENCOUNTER — Telehealth: Payer: Self-pay | Admitting: *Deleted

## 2011-06-14 NOTE — Telephone Encounter (Signed)
Called patient today to see if he was still needing the surgical referral and remind that we need him to bring information to get the [facility] card in order to do that. Patient did not answer my call but I did leave him a message to please give the office a call.

## 2011-12-19 ENCOUNTER — Other Ambulatory Visit: Payer: Self-pay

## 2011-12-19 ENCOUNTER — Ambulatory Visit: Payer: Self-pay

## 2011-12-25 ENCOUNTER — Other Ambulatory Visit: Payer: Self-pay | Admitting: *Deleted

## 2011-12-25 ENCOUNTER — Other Ambulatory Visit: Payer: Self-pay

## 2011-12-25 DIAGNOSIS — B20 Human immunodeficiency virus [HIV] disease: Secondary | ICD-10-CM

## 2011-12-25 MED ORDER — EFAVIRENZ-EMTRICITAB-TENOFOVIR 600-200-300 MG PO TABS: 1.0000 | ORAL_TABLET | Freq: Every day | ORAL | Status: DC

## 2011-12-26 ENCOUNTER — Other Ambulatory Visit: Payer: Self-pay

## 2011-12-26 ENCOUNTER — Ambulatory Visit: Payer: Self-pay

## 2012-02-13 ENCOUNTER — Ambulatory Visit: Payer: Self-pay | Admitting: Infectious Disease

## 2012-02-13 ENCOUNTER — Telehealth: Payer: Self-pay | Admitting: Licensed Clinical Social Worker

## 2012-02-13 NOTE — Telephone Encounter (Signed)
I called the number on the chart and no one answered. The patient no showed for labs and visit. I can refer to bridge counseling

## 2012-05-29 ENCOUNTER — Telehealth: Payer: Self-pay

## 2012-05-29 ENCOUNTER — Other Ambulatory Visit: Payer: Self-pay | Admitting: *Deleted

## 2012-05-29 ENCOUNTER — Ambulatory Visit: Payer: Self-pay

## 2012-05-29 DIAGNOSIS — B20 Human immunodeficiency virus [HIV] disease: Secondary | ICD-10-CM

## 2012-05-29 MED ORDER — EFAVIRENZ-EMTRICITAB-TENOFOVIR 600-200-300 MG PO TABS: 1.0000 | ORAL_TABLET | Freq: Every day | ORAL | Status: DC

## 2012-05-29 NOTE — Telephone Encounter (Signed)
Patient needs current labs done in order to send adap application - ones we have are over a year old - called patient and left message he needed to schedule labs right away

## 2012-06-02 ENCOUNTER — Telehealth: Payer: Self-pay

## 2012-06-02 NOTE — Telephone Encounter (Signed)
Tried patient again this am 4/28 - left message to call me - need current labs to complete adap application.

## 2012-06-16 ENCOUNTER — Telehealth: Payer: Self-pay

## 2012-06-16 NOTE — Telephone Encounter (Signed)
Unable to reach patient by phone - mailed letter to make lab appt in order to send current labs to ADAP, to take off pending status.

## 2012-06-16 NOTE — Telephone Encounter (Signed)
Patient's ADAP application was pended due to labs being over a year old - tried several times to reach patient before sending application, but never returned calls - needs to get current labs to take adap application off pending status - left message this morning for him to call the office and make appt.

## 2012-07-07 ENCOUNTER — Other Ambulatory Visit: Payer: Self-pay

## 2012-07-07 ENCOUNTER — Other Ambulatory Visit: Payer: Self-pay | Admitting: Infectious Disease

## 2012-07-07 DIAGNOSIS — B20 Human immunodeficiency virus [HIV] disease: Secondary | ICD-10-CM

## 2012-07-07 LAB — CBC WITH DIFFERENTIAL/PLATELET
Basophils Absolute: 0 10*3/uL (ref 0.0–0.1)
Basophils Relative: 0 % (ref 0–1)
HCT: 39.6 % (ref 39.0–52.0)
Lymphocytes Relative: 59 % — ABNORMAL HIGH (ref 12–46)
MCHC: 33.8 g/dL (ref 30.0–36.0)
Monocytes Absolute: 0.4 10*3/uL (ref 0.1–1.0)
Neutro Abs: 0.9 10*3/uL — ABNORMAL LOW (ref 1.7–7.7)
Neutrophils Relative %: 25 % — ABNORMAL LOW (ref 43–77)
Platelets: 190 10*3/uL (ref 150–400)
RDW: 14.2 % (ref 11.5–15.5)
WBC: 3.6 10*3/uL — ABNORMAL LOW (ref 4.0–10.5)

## 2012-07-07 LAB — COMPLETE METABOLIC PANEL WITH GFR
Albumin: 4.1 g/dL (ref 3.5–5.2)
BUN: 9 mg/dL (ref 6–23)
CO2: 26 mEq/L (ref 19–32)
Calcium: 9.3 mg/dL (ref 8.4–10.5)
Chloride: 106 mEq/L (ref 96–112)
Creat: 1.02 mg/dL (ref 0.50–1.35)
GFR, Est African American: >89 mL/min

## 2012-07-07 LAB — LIPID PANEL
Cholesterol: 102 mg/dL (ref 0–200)
HDL: 37 mg/dL — ABNORMAL LOW (ref >39)
Triglycerides: 43 mg/dL (ref <150)

## 2012-07-15 ENCOUNTER — Telehealth: Payer: Self-pay

## 2012-07-15 NOTE — Telephone Encounter (Signed)
Patient called about adap app - he had not done recent labs and had called him/emailed/letter for him to get labs - came in on 6/2 - faxed pended app w/current labs to ADAP today. Should hear in a few days.

## 2012-07-21 ENCOUNTER — Encounter: Payer: Self-pay | Admitting: Infectious Disease

## 2012-07-21 ENCOUNTER — Ambulatory Visit (INDEPENDENT_AMBULATORY_CARE_PROVIDER_SITE_OTHER): Payer: Self-pay | Admitting: Infectious Disease

## 2012-07-21 VITALS — BP 156/103 | HR 79 | Temp 98.6°F | Ht 67.0 in | Wt 174.0 lb

## 2012-07-21 DIAGNOSIS — Z23 Encounter for immunization: Secondary | ICD-10-CM

## 2012-07-21 DIAGNOSIS — B20 Human immunodeficiency virus [HIV] disease: Secondary | ICD-10-CM

## 2012-07-21 MED ORDER — TRAZODONE HCL 50 MG PO TABS: 50.0000 mg | ORAL_TABLET | Freq: Every day | ORAL | Status: DC

## 2012-07-21 MED ORDER — EFAVIRENZ-EMTRICITAB-TENOFOVIR 600-200-300 MG PO TABS: 1.0000 | ORAL_TABLET | Freq: Every day | ORAL | Status: DC

## 2012-07-21 NOTE — Progress Notes (Signed)
  Subjective:    Patient ID: Patrick Reilly, male    DOB: 10/25/1990, 22 y.o.   MRN: [id number]  HPI   22 year old [place] americ[place] man  Which in the past has been perfectly suppressed on Atripla, but who failed to renew his ADAP application and has not been on meds for nearly 1.5 months. His Viral load however was only 55 when checked earlier this month.   He appears to have always been suppressed while on a regimen. I dont know ? No VL checked between 2010 when he had VL >400K and when he was on meds in 03/2010 with undetectable VL.  I discussed possibility of STRIVING switch study IF he remains with undetectable VL and if his episode of missing meds is not an exlclusion criteria. He has no R mutations whatsoever in initial genotype.  He is suffering from insmonia  With difficulty with falling asleep. Doesn't feel the [place] is playign a role but worth considering this possibility.    Review of Systems  Constitutional: Negative for fever, chills, diaphoresis, activity change, appetite change, fatigue and unexpected weight change.  HENT: Negative for congestion, sore throat, rhinorrhea, sneezing, trouble swallowing and sinus pressure.   Eyes: Negative for photophobia and visual disturbance.  Respiratory: Negative for cough, chest tightness, shortness of breath, wheezing and stridor.   Cardiovascular: Negative for chest pain, palpitations and leg swelling.  Gastrointestinal: Negative for nausea, vomiting, abdominal pain, diarrhea, constipation, blood in stool, abdominal distention and anal bleeding.  Genitourinary: Negative for dysuria, hematuria, flank pain and difficulty urinating.  Musculoskeletal: Negative for myalgias, back pain, joint swelling, arthralgias and gait problem.  Skin: Negative for color change, pallor, rash and wound.  Neurological: Negative for dizziness, tremors, weakness and light-headedness.  Hematological: Negative for adenopathy. Does not bruise/bleed easily.   Psychiatric/Behavioral: Positive for sleep disturbance. Negative for behavioral problems, confusion, dysphoric mood, decreased concentration and agitation.       Objective:   Physical Exam  Constitutional: He is oriented to person, place, and time. He appears well-developed and well-nourished. No distress.  HENT:  Head: Normocephalic and atraumatic.  Mouth/Throat: Oropharynx is clear and moist. No oropharyngeal exudate.  Eyes: Conjunctivae and EOM are normal. Pupils are equal, round, and reactive to light. No scleral icterus.  Neck: Normal range of motion. Neck supple. No JVD present.  Cardiovascular: Normal rate, regular rhythm and normal heart sounds.  Exam reveals no gallop and no friction rub.   No murmur heard. Pulmonary/Chest: Effort normal and breath sounds normal. No respiratory distress. He has no wheezes. He has no rales. He exhibits no tenderness.  Abdominal: He exhibits no distension and no mass. There is no tenderness. There is no rebound and no guarding.  Musculoskeletal: He exhibits no edema and no tenderness.  Lymphadenopathy:    He has no cervical adenopathy.  Neurological: He is alert and oriented to person, place, and time. He has normal reflexes. He exhibits normal muscle tone. Coordination normal.  Skin: Skin is warm and dry. He is not diaphoretic. No erythema. No pallor.  Psychiatric: He has a normal mood and affect. His behavior is normal. Judgment and thought content normal.          Assessment & Plan:  HIV: adap renewed, ATripla re-written emphasized importance of trying to NEVER miss doses and to stay on top of ADAP renewal. Consider for STRIIVING swithc study   Insomnia:try trazadone  Need for hep A vax: re start series #1 today

## 2012-12-11 ENCOUNTER — Other Ambulatory Visit: Payer: Self-pay

## 2013-01-12 ENCOUNTER — Other Ambulatory Visit: Payer: Self-pay

## 2013-01-26 ENCOUNTER — Ambulatory Visit: Payer: Self-pay

## 2013-01-26 ENCOUNTER — Ambulatory Visit: Payer: Self-pay | Admitting: Infectious Disease

## 2013-02-10 ENCOUNTER — Other Ambulatory Visit (INDEPENDENT_AMBULATORY_CARE_PROVIDER_SITE_OTHER): Payer: Self-pay

## 2013-02-10 DIAGNOSIS — B20 Human immunodeficiency virus [HIV] disease: Secondary | ICD-10-CM

## 2013-02-10 LAB — COMPLETE METABOLIC PANEL WITH GFR
ALBUMIN: 4.5 g/dL (ref 3.5–5.2)
ALK PHOS: 59 U/L (ref 39–117)
ALT: 29 U/L (ref 0–53)
AST: 28 U/L (ref 0–37)
BILIRUBIN TOTAL: 0.3 mg/dL (ref 0.3–1.2)
BUN: 10 mg/dL (ref 6–23)
CO2: 26 mEq/L (ref 19–32)
Calcium: 9.2 mg/dL (ref 8.4–10.5)
Chloride: 108 mEq/L (ref 96–112)
Creat: 0.92 mg/dL (ref 0.50–1.35)
GFR, Est African American: >89 mL/min
GFR, Est Non African American: >89 mL/min
Glucose, Bld: 82 mg/dL (ref 70–99)
POTASSIUM: 4.2 meq/L (ref 3.5–5.3)
SODIUM: 141 meq/L (ref 135–145)
TOTAL PROTEIN: 7.4 g/dL (ref 6.0–8.3)

## 2013-02-11 LAB — CBC WITH DIFFERENTIAL/PLATELET
BASOS ABS: 0 10*3/uL (ref 0.0–0.1)
Basophils Relative: 0 % (ref 0–1)
Eosinophils Absolute: 0.1 10*3/uL (ref 0.0–0.7)
Eosinophils Relative: 2 % (ref 0–5)
HEMATOCRIT: 41.5 % (ref 39.0–52.0)
HEMOGLOBIN: 13.8 g/dL (ref 13.0–17.0)
LYMPHS ABS: 2 10*3/uL (ref 0.7–4.0)
LYMPHS PCT: 64 % — AB (ref 12–46)
MCH: 27.2 pg (ref 26.0–34.0)
MCHC: 33.3 g/dL (ref 30.0–36.0)
MCV: 81.7 fL (ref 78.0–100.0)
Monocytes Absolute: 0.3 10*3/uL (ref 0.1–1.0)
Monocytes Relative: 10 % (ref 3–12)
NEUTROS PCT: 24 % — AB (ref 43–77)
Neutro Abs: 0.8 10*3/uL — ABNORMAL LOW (ref 1.7–7.7)
PLATELETS: 211 10*3/uL (ref 150–400)
RBC: 5.08 MIL/uL (ref 4.22–5.81)
RDW: 13.9 % (ref 11.5–15.5)
WBC: 3.2 10*3/uL — ABNORMAL LOW (ref 4.0–10.5)

## 2013-02-11 LAB — HIV-1 RNA QUANT-NO REFLEX-BLD
HIV 1 RNA Quant: 32 copies/mL — ABNORMAL HIGH (ref <20)
HIV-1 RNA QUANT, LOG: 1.51 {Log} — AB (ref <1.30)

## 2013-02-12 LAB — T-HELPER CELL (CD4) - (RCID CLINIC ONLY)
CD4 % Helper T Cell: 27 % — ABNORMAL LOW (ref 33–55)
CD4 T CELL ABS: 600 /uL (ref 400–2700)

## 2013-02-13 ENCOUNTER — Other Ambulatory Visit: Payer: Self-pay | Admitting: *Deleted

## 2013-02-13 DIAGNOSIS — B20 Human immunodeficiency virus [HIV] disease: Secondary | ICD-10-CM

## 2013-02-13 MED ORDER — EFAVIRENZ-EMTRICITAB-TENOFOVIR 600-200-300 MG PO TABS: 1.0000 | ORAL_TABLET | Freq: Every day | ORAL | Status: DC

## 2013-02-25 ENCOUNTER — Encounter: Payer: Self-pay | Admitting: Infectious Disease

## 2013-02-25 ENCOUNTER — Ambulatory Visit (INDEPENDENT_AMBULATORY_CARE_PROVIDER_SITE_OTHER): Payer: Self-pay | Admitting: Infectious Disease

## 2013-02-25 VITALS — BP 146/94 | HR 71 | Temp 98.6°F | Ht 67.0 in | Wt 178.0 lb

## 2013-02-25 DIAGNOSIS — Z91199 Patient's noncompliance with other medical treatment and regimen due to unspecified reason: Secondary | ICD-10-CM

## 2013-02-25 DIAGNOSIS — Z23 Encounter for immunization: Secondary | ICD-10-CM

## 2013-02-25 DIAGNOSIS — B20 Human immunodeficiency virus [HIV] disease: Secondary | ICD-10-CM

## 2013-02-25 DIAGNOSIS — G4700 Insomnia, unspecified: Secondary | ICD-10-CM

## 2013-02-25 DIAGNOSIS — Z9119 Patient's noncompliance with other medical treatment and regimen: Secondary | ICD-10-CM

## 2013-02-25 NOTE — Progress Notes (Signed)
Subjective:    Patient ID: Patrick Reilly, male    DOB: 01/14/1991, 23 y.o.   MRN: [id number]  HPI   23 year old [place] man  Which in the past has been perfectly suppressed on Atripla, but who failed to renew his ADAP application  And had consequent viremia.  His viral load was 32 copies and early January and CD4 count is healthy. He tells me however he had been out of his Atripla since the middle of December. He states his meds will ship to his house by this Friday. This lapse which is again a second time and recent history again happened due to his failure to renew ADAP. Reason he told me he failed to renewed in the fall is that he was "out of town.  He should renew now for the spring to September session.  Will bring him back in 2 months time to recheck his viral load with reflex to genotype.     Review of Systems  Constitutional: Negative for fever, chills, diaphoresis, activity change, appetite change, fatigue and unexpected weight change.  HENT: Negative for congestion, rhinorrhea, sinus pressure, sneezing, sore throat and trouble swallowing.   Eyes: Negative for photophobia and visual disturbance.  Respiratory: Negative for cough, chest tightness, shortness of breath, wheezing and stridor.   Cardiovascular: Negative for chest pain, palpitations and leg swelling.  Gastrointestinal: Negative for nausea, vomiting, abdominal pain, diarrhea, constipation, blood in stool, abdominal distention and anal bleeding.  Genitourinary: Negative for dysuria, hematuria, flank pain and difficulty urinating.  Musculoskeletal: Negative for arthralgias, back pain, gait problem, joint swelling and myalgias.  Skin: Negative for color change, pallor, rash and wound.  Neurological: Negative for dizziness, tremors, weakness and light-headedness.  Hematological: Negative for adenopathy. Does not bruise/bleed easily.  Psychiatric/Behavioral: Positive for sleep disturbance. Negative for behavioral  problems, confusion, dysphoric mood, decreased concentration and agitation.       Objective:   Physical Exam  Constitutional: He is oriented to person, place, and time. He appears well-developed and well-nourished. No distress.  HENT:  Head: Normocephalic and atraumatic.  Mouth/Throat: Oropharynx is clear and moist. No oropharyngeal exudate.  Eyes: Conjunctivae and EOM are normal. Pupils are equal, round, and reactive to light. No scleral icterus.  Neck: Normal range of motion. Neck supple. No JVD present.  Cardiovascular: Normal rate, regular rhythm and normal heart sounds.  Exam reveals no gallop and no friction rub.   No murmur heard. Pulmonary/Chest: Effort normal and breath sounds normal. No respiratory distress. He has no wheezes. He has no rales. He exhibits no tenderness.  Abdominal: He exhibits no distension and no mass. There is no tenderness. There is no rebound and no guarding.  Musculoskeletal: He exhibits no edema and no tenderness.  Lymphadenopathy:    He has no cervical adenopathy.  Neurological: He is alert and oriented to person, place, and time. He has normal reflexes. He exhibits normal muscle tone. Coordination normal.  Skin: Skin is warm and dry. He is not diaphoretic. No erythema. No pallor.  Psychiatric: He has a normal mood and affect. His behavior is normal. Judgment and thought content normal.          Assessment & Plan:  HIV:  ATripla re-written emphasized importance of trying to NEVER miss doses and to stay on top of ADAP renewal. I will check reflex HIV genotype and CD4 in 6 weeks time with fu with me in 8 weeks time.  I spent greater than 25 minutes with the patient  including greater than 50% of time in face to face counsel of the patient and in coordination of their care.   I may be forced to put him on higher barrier to resitance regimen such as Prezista/COB pill plus truvada vs TRiumeq.   Need for hep A vax: recheck Hep A vaccine titer

## 2013-02-26 LAB — MICROALBUMIN / CREATININE URINE RATIO
CREATININE, URINE: 213.8 mg/dL
MICROALB UR: 0.84 mg/dL (ref 0.00–1.89)
Microalb Creat Ratio: 3.9 mg/g (ref 0.0–30.0)

## 2013-03-16 ENCOUNTER — Other Ambulatory Visit: Payer: Self-pay | Admitting: *Deleted

## 2013-03-16 DIAGNOSIS — B20 Human immunodeficiency virus [HIV] disease: Secondary | ICD-10-CM

## 2013-03-16 MED ORDER — EFAVIRENZ-EMTRICITAB-TENOFOVIR 600-200-300 MG PO TABS: 1.0000 | ORAL_TABLET | Freq: Every day | ORAL | Status: DC

## 2013-04-09 ENCOUNTER — Other Ambulatory Visit: Payer: Self-pay

## 2013-04-23 ENCOUNTER — Ambulatory Visit: Payer: Self-pay | Admitting: Infectious Disease

## 2013-10-26 ENCOUNTER — Telehealth: Payer: Self-pay | Admitting: *Deleted

## 2013-10-26 NOTE — Telephone Encounter (Signed)
Called patient and scheduled him a lab appt for 11/09/13. He will schedule the MD follow up after he checks his school schedule. Patrick Reilly

## 2013-11-09 ENCOUNTER — Other Ambulatory Visit (INDEPENDENT_AMBULATORY_CARE_PROVIDER_SITE_OTHER): Payer: Self-pay

## 2013-11-09 DIAGNOSIS — Z79899 Other long term (current) drug therapy: Secondary | ICD-10-CM

## 2013-11-09 DIAGNOSIS — B20 Human immunodeficiency virus [HIV] disease: Secondary | ICD-10-CM

## 2013-11-09 DIAGNOSIS — Z113 Encounter for screening for infections with a predominantly sexual mode of transmission: Secondary | ICD-10-CM

## 2013-11-10 LAB — CBC WITH DIFFERENTIAL/PLATELET
Basophils Absolute: 0 10*3/uL (ref 0.0–0.1)
Basophils Relative: 0 % (ref 0–1)
EOS PCT: 2 % (ref 0–5)
Eosinophils Absolute: 0.1 10*3/uL (ref 0.0–0.7)
HEMATOCRIT: 38.9 % — AB (ref 39.0–52.0)
HEMOGLOBIN: 13.3 g/dL (ref 13.0–17.0)
LYMPHS ABS: 2.9 10*3/uL (ref 0.7–4.0)
LYMPHS PCT: 67 % — AB (ref 12–46)
MCH: 27.4 pg (ref 26.0–34.0)
MCHC: 34.2 g/dL (ref 30.0–36.0)
MCV: 80.2 fL (ref 78.0–100.0)
MONO ABS: 0.4 10*3/uL (ref 0.1–1.0)
MONOS PCT: 10 % (ref 3–12)
NEUTROS ABS: 0.9 10*3/uL — AB (ref 1.7–7.7)
Neutrophils Relative %: 21 % — ABNORMAL LOW (ref 43–77)
Platelets: 213 10*3/uL (ref 150–400)
RBC: 4.85 MIL/uL (ref 4.22–5.81)
RDW: 14.3 % (ref 11.5–15.5)
WBC: 4.3 10*3/uL (ref 4.0–10.5)

## 2013-11-10 LAB — COMPLETE METABOLIC PANEL WITH GFR
ALK PHOS: 65 U/L (ref 39–117)
ALT: 30 U/L (ref 0–53)
AST: 24 U/L (ref 0–37)
Albumin: 4.3 g/dL (ref 3.5–5.2)
BILIRUBIN TOTAL: 0.2 mg/dL (ref 0.2–1.2)
BUN: 10 mg/dL (ref 6–23)
CO2: 29 mEq/L (ref 19–32)
Calcium: 9 mg/dL (ref 8.4–10.5)
Chloride: 103 mEq/L (ref 96–112)
Creat: 0.82 mg/dL (ref 0.50–1.35)
GFR, Est African American: >89 mL/min
GLUCOSE: 84 mg/dL (ref 70–99)
POTASSIUM: 4 meq/L (ref 3.5–5.3)
Sodium: 139 mEq/L (ref 135–145)
Total Protein: 7.3 g/dL (ref 6.0–8.3)

## 2013-11-10 LAB — LIPID PANEL
Cholesterol: 96 mg/dL (ref 0–200)
HDL: 40 mg/dL (ref >39)
LDL CALC: 39 mg/dL (ref 0–99)
Total CHOL/HDL Ratio: 2.4 Ratio
Triglycerides: 85 mg/dL (ref <150)
VLDL: 17 mg/dL (ref 0–40)

## 2013-11-10 LAB — RPR

## 2013-11-10 LAB — HEPATITIS A ANTIBODY, TOTAL: Hep A Total Ab: REACTIVE — AB

## 2013-11-11 LAB — HIV-1 RNA ULTRAQUANT REFLEX TO GENTYP+: HIV-1 RNA Quant, Log: <1.3 {Log} (ref <1.30)

## 2013-11-11 LAB — T-HELPER CELL (CD4) - (RCID CLINIC ONLY)
CD4 T CELL HELPER: 29 % — AB (ref 33–55)
CD4 T Cell Abs: 790 /uL (ref 400–2700)

## 2013-11-26 ENCOUNTER — Other Ambulatory Visit: Payer: Self-pay | Admitting: *Deleted

## 2013-11-26 DIAGNOSIS — B20 Human immunodeficiency virus [HIV] disease: Secondary | ICD-10-CM

## 2013-11-26 MED ORDER — EFAVIRENZ-EMTRICITAB-TENOFOVIR 600-200-300 MG PO TABS: 1.0000 | ORAL_TABLET | Freq: Every day | ORAL | Status: DC

## 2013-12-22 ENCOUNTER — Ambulatory Visit (INDEPENDENT_AMBULATORY_CARE_PROVIDER_SITE_OTHER): Payer: Self-pay | Admitting: Infectious Disease

## 2013-12-22 ENCOUNTER — Ambulatory Visit (INDEPENDENT_AMBULATORY_CARE_PROVIDER_SITE_OTHER): Payer: Self-pay | Admitting: *Deleted

## 2013-12-22 ENCOUNTER — Encounter: Payer: Self-pay | Admitting: Infectious Disease

## 2013-12-22 VITALS — BP 140/93 | HR 85 | Temp 98.9°F | Ht 67.0 in | Wt 165.0 lb

## 2013-12-22 DIAGNOSIS — Z23 Encounter for immunization: Secondary | ICD-10-CM

## 2013-12-22 DIAGNOSIS — I10 Essential (primary) hypertension: Secondary | ICD-10-CM

## 2013-12-22 DIAGNOSIS — B20 Human immunodeficiency virus [HIV] disease: Secondary | ICD-10-CM

## 2013-12-22 NOTE — Progress Notes (Signed)
  Subjective:    Patient ID: Patrick Reilly, male    DOB: 09/17/1990, 23 y.o.   MRN: [id number]  HPI   23 year old [place]  Which in the past has been perfectly suppressed on Atripla, but who failed to renew his ADAP application YET AGAIN this fall and missed 2 weeks of Atripla. 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 He denies being sexually active.    Review of Systems  Constitutional: Negative for fever, chills, diaphoresis, activity change, appetite change, fatigue and unexpected weight change.  HENT: Negative for congestion, rhinorrhea, sinus pressure, sneezing, sore throat and trouble swallowing.   Eyes: Negative for photophobia and visual disturbance.  Respiratory: Negative for cough, chest tightness, shortness of breath, wheezing and stridor.   Cardiovascular: Negative for chest pain, palpitations and leg swelling.  Gastrointestinal: Negative for nausea, vomiting, abdominal pain, diarrhea, constipation, blood in stool, abdominal distention and anal bleeding.  Genitourinary: Negative for dysuria, hematuria, flank pain and difficulty urinating.  Musculoskeletal: Negative for myalgias, back pain, joint swelling, arthralgias and gait problem.  Skin: Negative for color change, pallor, rash and wound.  Neurological: Negative for dizziness, tremors, weakness and light-headedness.  Hematological: Negative for adenopathy. Does not bruise/bleed easily.  Psychiatric/Behavioral: Positive for sleep disturbance. Negative for behavioral problems, confusion, dysphoric mood, decreased concentration and agitation.       Objective:   Physical Exam  Constitutional: He is oriented to person, place, and time. He appears well-developed and well-nourished. No distress.  HENT:  Head: Normocephalic and atraumatic.  Mouth/Throat: Oropharynx is clear and moist. No  oropharyngeal exudate.  Eyes: Conjunctivae and EOM are normal. No scleral icterus.  Neck: Normal range of motion. Neck supple. No JVD present.  Cardiovascular: Normal rate and regular rhythm.   Pulmonary/Chest: Effort normal. No respiratory distress. He has no wheezes.  Abdominal: He exhibits no distension.  Musculoskeletal: He exhibits no edema or tenderness.  Lymphadenopathy:    He has no cervical adenopathy.  Neurological: He is alert and oriented to person, place, and time. He exhibits normal muscle tone. Coordination normal.  Skin: Skin is warm and dry. He is not diaphoretic. No erythema. No pallor.  Psychiatric: He has a normal mood and affect. His behavior is normal. Judgment and thought content normal.          Assessment & Plan:  HIV: continue ATRIPLA but plan on change to TAF based regimen this Spring  I spent greater than 25 minutes with the patient including greater than 50% of time in face to face counsel of the patient and in coordination of their care.  Insomnia: can use Trazadone  HTN: Bp up again, Need to do ambulatory cuff  Smoking: spent greater than 3 minutes counselling on smoking cessation

## 2014-02-03 ENCOUNTER — Emergency Department (INDEPENDENT_AMBULATORY_CARE_PROVIDER_SITE_OTHER)
Admission: EM | Admit: 2014-02-03 | Discharge: 2014-02-03 | Disposition: A | Discharge status: Home / Self Care | Payer: Self-pay | Source: Home / Self Care | Attending: Emergency Medicine | Admitting: Emergency Medicine

## 2014-02-03 ENCOUNTER — Encounter (HOSPITAL_COMMUNITY): Payer: Self-pay

## 2014-02-03 ENCOUNTER — Other Ambulatory Visit (HOSPITAL_COMMUNITY)
Admission: RE | Admit: 2014-02-03 | Discharge: 2014-02-03 | Disposition: A | Discharge status: Home / Self Care | Payer: Self-pay | Source: Ambulatory Visit | Attending: Emergency Medicine | Admitting: Emergency Medicine

## 2014-02-03 DIAGNOSIS — R300 Dysuria: Secondary | ICD-10-CM

## 2014-02-03 DIAGNOSIS — Z113 Encounter for screening for infections with a predominantly sexual mode of transmission: Secondary | ICD-10-CM | POA: Insufficient documentation

## 2014-02-03 MED ORDER — CEPHALEXIN 500 MG PO CAPS: 500.0000 mg | ORAL_CAPSULE | Freq: Three times a day (TID) | ORAL | Status: DC

## 2014-02-03 MED ORDER — CEFTRIAXONE SODIUM 250 MG IJ SOLR
250.0000 mg | Freq: Once | INTRAMUSCULAR | Status: AC
  Administered 2014-02-03: 250 mg via INTRAMUSCULAR

## 2014-02-03 MED ORDER — AZITHROMYCIN 250 MG PO TABS
ORAL_TABLET | ORAL | Status: AC
  Filled 2014-02-03: qty 4

## 2014-02-03 MED ORDER — AZITHROMYCIN 250 MG PO TABS
1000.0000 mg | ORAL_TABLET | Freq: Once | ORAL | Status: AC
  Administered 2014-02-03: 1000 mg via ORAL

## 2014-02-03 MED ORDER — LIDOCAINE HCL (PF) 1 % IJ SOLN
INTRAMUSCULAR | Status: AC
Start: 2014-02-03 — End: 2014-02-03
  Filled 2014-02-03: qty 5

## 2014-02-03 MED ORDER — CEFTRIAXONE SODIUM 250 MG IJ SOLR
INTRAMUSCULAR | Status: AC
  Filled 2014-02-03: qty 250

## 2014-02-03 NOTE — ED Provider Notes (Signed)
   Chief Complaint   SEXUALLY TRANSMITTED DISEASE   History of Present Illness   Patrick Reilly is a 23 year old HIV-positive male who's had a two-day history of dysuria and discharge. He is sexually active with his partner. They're both HIV-positive. He does not use protection. He denies any lesions on the penis, adenopathy, or testicular pain or swelling. He's had no abdominal pain, back pain, nausea or vomiting. He denies fever or chills. No eye symptoms, sore throat, joint pains, or skin rash.  Review of Systems   Other than as noted above, the patient denies any of the following symptoms: General:  No fever or chills. GI:  No abdominal pain, back pain, nausea, or vomiting. GU: Hematuria, urethral discharge, penile lesions, penile pain, testicular pain, swelling, or mass, or inguinal lymphadenopathy.  PMFSH   Past medical history, family history, social history, meds, and allergies were reviewed.  He is HIV-positive and takes Atripla.  Physical Exam    Vital signs:  There were no vitals taken for this visit. Gen:  Alert, oriented, in no distress. Lungs:  Clear to auscultation, no wheezes, rales or rhonchi. Heart:  Regular rhythm, no gallop or murmer. Abdomen:  Flat and soft.  No tenderness to palpation, guarding, or rebound.  No hepato-splenomegaly or mass.  Bowel sounds were normally active.  No hernia. Genital exam:  No urethral discharge, no penile lesions, no inguinal adenopathy, testes are nontender, not swollen and no masses. Back:  No CVA tenderness.  Skin:  Clear, warm and dry.  Labs   Urinalysis, urine culture, and DNA probes for gonorrhea, Chlamydia, Trichomonas obtained.   Course in [facility]gent [facility]   Given Rocephin 250 mg IM and azithromycin 1000 mg by mouth.  Assessment   The encounter diagnosis was Dysuria.   Differential diagnosis is urethritis versus urinary tract infection. Cultures and DNA probes are pending.  Plan   1.  Meds:  The following meds  were prescribed:   New Prescriptions   CEPHALEXIN (KEFLEX) 500 MG CAPSULE    Take 1 capsule (500 mg total) by mouth 3 (three) times daily.    2.  Patient Education/Counseling:  The patient was given appropriate handouts, self care instructions, and instructed in symptomatic relief.  Instructed to avoid intercourse altogether for a week, and thereafter only with condoms.  3.  Follow up:  The patient was told to follow up here if no better in 3 to 4 days, or sooner if becoming worse in any way, and given some red flag symptoms such as fever, persistent vomiting, pain, or difficulty urinating which would prompt immediate return.        Reuben Likesavid C Jasn Xia, MD 02/03/14 [phone]

## 2014-02-03 NOTE — Discharge Instructions (Signed)

## 2014-02-03 NOTE — ED Notes (Signed)
Unprotected sex in HIV positive patient. Partner has been checked, but does not have his report. Patient c/o discomfort w urination since yesterday. NAD

## 2014-02-04 LAB — URINE CULTURE
Colony Count: NO GROWTH
Culture: NO GROWTH

## 2014-02-04 LAB — URINE CYTOLOGY ANCILLARY ONLY
Chlamydia: NEGATIVE
NEISSERIA GONORRHEA: NEGATIVE

## 2014-02-08 ENCOUNTER — Ambulatory Visit: Payer: Self-pay | Admitting: Internal Medicine

## 2014-02-22 ENCOUNTER — Telehealth: Payer: Self-pay | Admitting: *Deleted

## 2014-02-22 ENCOUNTER — Ambulatory Visit: Payer: Self-pay | Admitting: Infectious Disease

## 2014-02-22 NOTE — Telephone Encounter (Signed)
Called patient to notify of missed visit.  Informed him of our no-show/walk-in policy.  Patient verbalized agreement.  Scheduled 1/19 for labs, 1/28 for follow up with Dr. Lennox LaityVan Dam. Lura Falor, Zachary GeorgeMichelle M, RN

## 2014-02-23 ENCOUNTER — Other Ambulatory Visit (INDEPENDENT_AMBULATORY_CARE_PROVIDER_SITE_OTHER): Payer: Self-pay

## 2014-02-23 DIAGNOSIS — B20 Human immunodeficiency virus [HIV] disease: Secondary | ICD-10-CM

## 2014-02-23 DIAGNOSIS — Z113 Encounter for screening for infections with a predominantly sexual mode of transmission: Secondary | ICD-10-CM

## 2014-02-23 LAB — CBC WITH DIFFERENTIAL/PLATELET
Basophils Absolute: 0 10*3/uL (ref 0.0–0.1)
Basophils Relative: 0 % (ref 0–1)
Eosinophils Absolute: 0 10*3/uL (ref 0.0–0.7)
Eosinophils Relative: 1 % (ref 0–5)
HCT: 41.3 % (ref 39.0–52.0)
HEMOGLOBIN: 13.7 g/dL (ref 13.0–17.0)
LYMPHS ABS: 2.3 10*3/uL (ref 0.7–4.0)
LYMPHS PCT: 60 % — AB (ref 12–46)
MCH: 27.2 pg (ref 26.0–34.0)
MCHC: 33.2 g/dL (ref 30.0–36.0)
MCV: 82.1 fL (ref 78.0–100.0)
MONO ABS: 0.3 10*3/uL (ref 0.1–1.0)
MPV: 9.5 fL (ref 8.6–12.4)
Monocytes Relative: 9 % (ref 3–12)
NEUTROS PCT: 30 % — AB (ref 43–77)
Neutro Abs: 1.1 10*3/uL — ABNORMAL LOW (ref 1.7–7.7)
Platelets: 193 10*3/uL (ref 150–400)
RBC: 5.03 MIL/uL (ref 4.22–5.81)
RDW: 15.8 % — ABNORMAL HIGH (ref 11.5–15.5)
WBC: 3.8 10*3/uL — ABNORMAL LOW (ref 4.0–10.5)

## 2014-02-23 LAB — COMPLETE METABOLIC PANEL WITH GFR
ALT: 27 U/L (ref 0–53)
AST: 22 U/L (ref 0–37)
Albumin: 4.3 g/dL (ref 3.5–5.2)
Alkaline Phosphatase: 66 U/L (ref 39–117)
BILIRUBIN TOTAL: 0.3 mg/dL (ref 0.2–1.2)
BUN: 12 mg/dL (ref 6–23)
CALCIUM: 9.2 mg/dL (ref 8.4–10.5)
CHLORIDE: 103 meq/L (ref 96–112)
CO2: 26 mEq/L (ref 19–32)
Creat: 0.89 mg/dL (ref 0.50–1.35)
GFR, Est Non African American: >89 mL/min
GLUCOSE: 81 mg/dL (ref 70–99)
POTASSIUM: 3.9 meq/L (ref 3.5–5.3)
Sodium: 135 mEq/L (ref 135–145)
Total Protein: 7.7 g/dL (ref 6.0–8.3)

## 2014-02-24 LAB — HIV-1 RNA QUANT-NO REFLEX-BLD

## 2014-02-24 LAB — RPR

## 2014-02-24 LAB — URINE CYTOLOGY ANCILLARY ONLY
Chlamydia: NEGATIVE
Neisseria Gonorrhea: NEGATIVE

## 2014-02-24 LAB — T-HELPER CELL (CD4) - (RCID CLINIC ONLY)
CD4 T CELL HELPER: 30 % — AB (ref 33–55)
CD4 T Cell Abs: 690 /uL (ref 400–2700)

## 2014-03-04 ENCOUNTER — Encounter: Payer: Self-pay | Admitting: Infectious Disease

## 2014-03-04 ENCOUNTER — Ambulatory Visit (INDEPENDENT_AMBULATORY_CARE_PROVIDER_SITE_OTHER): Payer: Self-pay | Admitting: Infectious Disease

## 2014-03-04 VITALS — BP 142/92 | HR 68 | Temp 98.2°F | Ht 66.0 in | Wt 165.0 lb

## 2014-03-04 DIAGNOSIS — B20 Human immunodeficiency virus [HIV] disease: Secondary | ICD-10-CM

## 2014-03-04 DIAGNOSIS — B353 Tinea pedis: Secondary | ICD-10-CM | POA: Insufficient documentation

## 2014-03-04 DIAGNOSIS — R369 Urethral discharge, unspecified: Secondary | ICD-10-CM | POA: Insufficient documentation

## 2014-03-04 MED ORDER — FLUCONAZOLE 100 MG PO TABS: 100.0000 mg | ORAL_TABLET | Freq: Every day | ORAL | Status: DC

## 2014-03-04 NOTE — Progress Notes (Signed)
  Subjective:    Patient ID: Patrick Reilly, male    DOB: 11/14/1990, 24 y.o.   MRN: [id number]  HPI   24 year old [place] man  Which in the past has been perfectly suppressed on Atripla, but who failed to renew his ADAP application YET AGAIN this fall and missed 2 weeks of Atripla. 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 He denies being sexually active when I saw him last but he actually showed up to the Emergency room with discharge from his penis after unprotected sexual intercourse with his partner. [facility] and chlamydia and urine culture which were negative. He was given rocephin and azithromycin and then oral keflex. He has been bothered by athlete's foot which has not responded to topical antifungals--"I've tried everything."    Review of Systems  Constitutional: Negative for fever, chills, diaphoresis, activity change, appetite change, fatigue and unexpected weight change.  HENT: Negative for congestion, rhinorrhea, sinus pressure, sneezing, sore throat and trouble swallowing.   Eyes: Negative for photophobia and visual disturbance.  Respiratory: Negative for cough, chest tightness, shortness of breath, wheezing and stridor.   Cardiovascular: Negative for chest pain, palpitations and leg swelling.  Gastrointestinal: Negative for nausea, vomiting, abdominal pain, diarrhea, constipation, blood in stool, abdominal distention and anal bleeding.  Genitourinary: Negative for dysuria, hematuria, flank pain and difficulty urinating.  Musculoskeletal: Negative for myalgias, back pain, joint swelling, arthralgias and gait problem.  Skin: Negative for color change, pallor, rash and wound.  Neurological: Negative for dizziness, tremors, weakness and light-headedness.  Hematological: Negative for adenopathy. Does not bruise/bleed easily.  Psychiatric/Behavioral:  Positive for sleep disturbance. Negative for behavioral problems, confusion, dysphoric mood, decreased concentration and agitation.       Objective:   Physical Exam  Constitutional: He is oriented to person, place, and time. He appears well-developed and well-nourished. No distress.  HENT:  Head: Normocephalic and atraumatic.  Mouth/Throat: Oropharynx is clear and moist. No oropharyngeal exudate.  Eyes: Conjunctivae and EOM are normal. No scleral icterus.  Neck: Normal range of motion. Neck supple. No JVD present.  Cardiovascular: Normal rate and regular rhythm.   Pulmonary/Chest: Effort normal. No respiratory distress. He has no wheezes.  Abdominal: He exhibits no distension.  Musculoskeletal: He exhibits no edema or tenderness.  Lymphadenopathy:    He has no cervical adenopathy.  Neurological: He is alert and oriented to person, place, and time. He exhibits normal muscle tone. Coordination normal.  Skin: Skin is warm and dry. He is not diaphoretic. No erythema. No pallor.  Psychiatric: He has a normal mood and affect. His behavior is normal. Judgment and thought content normal.          Assessment & Plan:  HIV: continue ATRIPLA but plan on change to TAF based regimen when more drugs become available  I spent greater than 25 minutes with the patient including greater than 50% of time in face to face counsel of the patient and in coordination of their care.  HTN: Bp up again, Need to do ambulatory cuff as I stressed again  Penile dc: negative for GC and chlamydia. Likely had Enteric pathogen  Athletes foot:  Fluconazole for a month and reassess

## 2014-03-08 ENCOUNTER — Ambulatory Visit: Payer: Self-pay

## 2014-03-24 ENCOUNTER — Other Ambulatory Visit: Payer: Self-pay | Admitting: *Deleted

## 2014-03-24 DIAGNOSIS — B20 Human immunodeficiency virus [HIV] disease: Secondary | ICD-10-CM

## 2014-03-24 MED ORDER — EFAVIRENZ-EMTRICITAB-TENOFOVIR 600-200-300 MG PO TABS: 1.0000 | ORAL_TABLET | Freq: Every day | ORAL | Status: DC

## 2014-03-24 NOTE — Telephone Encounter (Signed)
ADAP Application 

## 2014-04-07 ENCOUNTER — Encounter: Payer: Self-pay | Admitting: Infectious Disease

## 2014-04-07 ENCOUNTER — Ambulatory Visit (INDEPENDENT_AMBULATORY_CARE_PROVIDER_SITE_OTHER): Payer: Self-pay | Admitting: Infectious Disease

## 2014-04-07 VITALS — BP 136/86 | HR 78 | Temp 98.5°F | Wt 173.5 lb

## 2014-04-07 DIAGNOSIS — R369 Urethral discharge, unspecified: Secondary | ICD-10-CM

## 2014-04-07 DIAGNOSIS — B20 Human immunodeficiency virus [HIV] disease: Secondary | ICD-10-CM

## 2014-04-07 DIAGNOSIS — B353 Tinea pedis: Secondary | ICD-10-CM

## 2014-04-07 NOTE — Progress Notes (Signed)
  Subjective:    Patient ID: Patrick Reilly, male    DOB: 08/01/1990, 24 y.o.   MRN: [id number]  HPI   24 year old [place] who is perfectly suppressed on his antiretroviral regimen of Atripla.  Lab Results  Component Value Date   HIV1RNAQUANT <20 02/23/2014   Lab Results  Component Value Date   CD4TABS 690 02/23/2014   CD4TABS 790 11/09/2013   CD4TABS 600 02/10/2013   He has some trouble with athletes foot that is responded to fluconazole 1 month. He is feeling relatively well and we discussed other single tablet regimens for him including GENVOYA and new TAF based RPV STR along with TRIUMEQ.    Review of Systems  Constitutional: Negative for fever, chills, diaphoresis, activity change, appetite change, fatigue and unexpected weight change.  HENT: Negative for congestion, rhinorrhea, sinus pressure, sneezing, sore throat and trouble swallowing.   Eyes: Negative for photophobia and visual disturbance.  Respiratory: Negative for cough, chest tightness, shortness of breath, wheezing and stridor.   Cardiovascular: Negative for chest pain, palpitations and leg swelling.  Gastrointestinal: Negative for nausea, vomiting, abdominal pain, diarrhea, constipation, blood in stool, abdominal distention and anal bleeding.  Genitourinary: Negative for dysuria, hematuria, flank pain and difficulty urinating.  Musculoskeletal: Negative for myalgias, back pain, joint swelling, arthralgias and gait problem.  Skin: Negative for color change, pallor, rash and wound.  Neurological: Negative for dizziness, tremors, weakness and light-headedness.  Hematological: Negative for adenopathy. Does not bruise/bleed easily.  Psychiatric/Behavioral: Negative for behavioral problems, confusion, dysphoric mood, decreased concentration and agitation.       Objective:   Physical Exam  Constitutional: He is oriented to person, place, and time. He appears well-developed and well-nourished. No distress.   HENT:  Head: Normocephalic and atraumatic.  Mouth/Throat: Oropharynx is clear and moist. No oropharyngeal exudate.  Eyes: Conjunctivae and EOM are normal. No scleral icterus.  Neck: Normal range of motion. Neck supple. No JVD present.  Cardiovascular: Normal rate and regular rhythm.   Pulmonary/Chest: Effort normal. No respiratory distress. He has no wheezes.  Abdominal: He exhibits no distension.  Musculoskeletal: He exhibits no edema or tenderness.  Lymphadenopathy:    He has no cervical adenopathy.  Neurological: He is alert and oriented to person, place, and time. He exhibits normal muscle tone. Coordination normal.  Skin: Skin is warm and dry. He is not diaphoretic. No erythema. No pallor.  Psychiatric: He has a normal mood and affect. His behavior is normal. Judgment and thought content normal.          Assessment & Plan:  HIV: continue ATRIPLA but plan on change to TAF based regimen vs TRIUMEQ  I spent greater than 25 minutes with the patient including greater than 50% of time in face to face counsel of the patient and in coordination of their care.  HTN: BP better controlled  Penile dc: resolved  Athletes foot: Improved on fluconazole  Smoking: I spent greater than 3 minutes and counseled the patient regarding smoking cessation.

## 2014-04-26 ENCOUNTER — Other Ambulatory Visit: Payer: Self-pay | Admitting: *Deleted

## 2014-04-26 DIAGNOSIS — B20 Human immunodeficiency virus [HIV] disease: Secondary | ICD-10-CM

## 2014-04-26 MED ORDER — EFAVIRENZ-EMTRICITAB-TENOFOVIR 600-200-300 MG PO TABS: 1.0000 | ORAL_TABLET | Freq: Every day | ORAL | Status: DC

## 2014-05-03 ENCOUNTER — Ambulatory Visit: Payer: Self-pay | Admitting: Infectious Disease

## 2014-08-04 ENCOUNTER — Other Ambulatory Visit: Payer: Self-pay

## 2014-08-18 ENCOUNTER — Ambulatory Visit: Payer: Self-pay | Admitting: Infectious Disease

## 2015-02-24 ENCOUNTER — Telehealth: Payer: Self-pay | Admitting: *Deleted

## 2015-02-24 NOTE — Telephone Encounter (Signed)
Pt needs to call for MD visit

## 2015-05-31 ENCOUNTER — Ambulatory Visit: Payer: Self-pay

## 2015-08-14 ENCOUNTER — Emergency Department (HOSPITAL_COMMUNITY)
Admission: EM | Admit: 2015-08-14 | Discharge: 2015-08-14 | Disposition: A | Discharge status: Home / Self Care | Payer: BLUE CROSS/BLUE SHIELD | Attending: Emergency Medicine | Admitting: Emergency Medicine

## 2015-08-14 ENCOUNTER — Encounter (HOSPITAL_COMMUNITY): Payer: Self-pay | Admitting: *Deleted

## 2015-08-14 DIAGNOSIS — K2971 Gastritis, unspecified, with bleeding: Secondary | ICD-10-CM | POA: Diagnosis not present

## 2015-08-14 DIAGNOSIS — Z21 Asymptomatic human immunodeficiency virus [HIV] infection status: Secondary | ICD-10-CM | POA: Insufficient documentation

## 2015-08-14 DIAGNOSIS — I10 Essential (primary) hypertension: Secondary | ICD-10-CM | POA: Insufficient documentation

## 2015-08-14 DIAGNOSIS — F17210 Nicotine dependence, cigarettes, uncomplicated: Secondary | ICD-10-CM | POA: Insufficient documentation

## 2015-08-14 DIAGNOSIS — Z79899 Other long term (current) drug therapy: Secondary | ICD-10-CM | POA: Insufficient documentation

## 2015-08-14 DIAGNOSIS — K610 Anal abscess: Secondary | ICD-10-CM | POA: Insufficient documentation

## 2015-08-14 DIAGNOSIS — K922 Gastrointestinal hemorrhage, unspecified: Secondary | ICD-10-CM

## 2015-08-14 DIAGNOSIS — K921 Melena: Secondary | ICD-10-CM | POA: Diagnosis present

## 2015-08-14 LAB — COMPREHENSIVE METABOLIC PANEL
ALBUMIN: 4.1 g/dL (ref 3.5–5.0)
ALT: 16 U/L — AB (ref 17–63)
AST: 20 U/L (ref 15–41)
Alkaline Phosphatase: 68 U/L (ref 38–126)
Anion gap: 7 (ref 5–15)
BUN: 11 mg/dL (ref 6–20)
CHLORIDE: 106 mmol/L (ref 101–111)
CO2: 25 mmol/L (ref 22–32)
Calcium: 9 mg/dL (ref 8.9–10.3)
Creatinine, Ser: 0.88 mg/dL (ref 0.61–1.24)
GFR calc non Af Amer: >60 mL/min (ref >60)
GLUCOSE: 90 mg/dL (ref 65–99)
Potassium: 3.6 mmol/L (ref 3.5–5.1)
Sodium: 138 mmol/L (ref 135–145)
Total Bilirubin: 0.1 mg/dL — ABNORMAL LOW (ref 0.3–1.2)
Total Protein: 8 g/dL (ref 6.5–8.1)

## 2015-08-14 LAB — POC OCCULT BLOOD, ED: Fecal Occult Bld: NEGATIVE

## 2015-08-14 LAB — CBC
HCT: 37.3 % — ABNORMAL LOW (ref 39.0–52.0)
HEMOGLOBIN: 12.3 g/dL — AB (ref 13.0–17.0)
MCH: 27 pg (ref 26.0–34.0)
MCHC: 33 g/dL (ref 30.0–36.0)
MCV: 81.8 fL (ref 78.0–100.0)
Platelets: 222 10*3/uL (ref 150–400)
RBC: 4.56 MIL/uL (ref 4.22–5.81)
RDW: 14.1 % (ref 11.5–15.5)
WBC: 5 10*3/uL (ref 4.0–10.5)

## 2015-08-14 LAB — ABO/RH: ABO/RH(D): O POS

## 2015-08-14 LAB — TYPE AND SCREEN
ABO/RH(D): O POS
Antibody Screen: NEGATIVE

## 2015-08-14 MED ORDER — AMOXICILLIN-POT CLAVULANATE 500-125 MG PO TABS: 1.0000 | ORAL_TABLET | Freq: Three times a day (TID) | ORAL | Status: DC

## 2015-08-14 NOTE — ED Notes (Signed)
Pt reports blood in his stool and abd cramping x 1 week. Pt reports bright red blood.  Pt also reports an abscess in his buttocks with some drainage.  Pt denies n/v or any other symptoms.

## 2015-08-14 NOTE — ED Notes (Signed)
MD at bedside. 

## 2015-08-14 NOTE — ED Provider Notes (Signed)
CSN: [id number]     Arrival date & time 08/14/15  1305 History   First MD Initiated Contact with Patient 08/14/15 1514     Chief Complaint  Patient presents with  . Rectal Bleeding  . Abdominal Pain      Patient is a 25 y.o. male presenting with hematochezia and abdominal pain. The history is provided by the patient.  Rectal Bleeding Associated symptoms: abdominal pain   Associated symptoms: no vomiting   Abdominal Pain Associated symptoms: hematochezia   Associated symptoms: no chest pain, no constipation, no diarrhea, no shortness of breath and no vomiting   Patient presents with rectal bleeding. States she's had for last 5 days. States began heavier and has been decreasing little bit. States she's also had some loose stool with it but not so diarrhea. States her is pain with bowel movements. No fevers. No lightheadedness or dizziness. Slight upper abdominal pain with it too. He has not had bleeding like this before. Also thinks he has an infected hair in his anal area. States he's had some drainage for the last few days.states it is been draining pus. Some pain with bowel movement was. States he's had infections of the spot before that had to be drained.  Past Medical History  Diagnosis Date  . HIV infection (HCC)   . Hypertension    History reviewed. No pertinent past surgical history. No family history on file. Social History  Substance Use Topics  . Smoking status: Current Some Day Smoker -- 0.10 packs/day    Types: Cigarettes  . Smokeless tobacco: Never Used     Comment: only smokes when drinking  . Alcohol Use: 1.2 oz/week    2 Standard drinks or equivalent per week     Comment: wine, shots, beer on weekends    Review of Systems  Constitutional: Negative for activity change and appetite change.  HENT: Negative for tinnitus.   Eyes: Negative for pain.  Respiratory: Negative for chest tightness and shortness of breath.   Cardiovascular: Negative for chest pain and leg  swelling.  Gastrointestinal: Positive for abdominal pain, hematochezia, anal bleeding and rectal pain. Negative for vomiting, diarrhea and constipation.  Genitourinary: Negative for flank pain.  Musculoskeletal: Negative for back pain and neck stiffness.  Skin: Negative for rash.  Neurological: Negative for weakness, numbness and headaches.  Hematological: Does not bruise/bleed easily.  Psychiatric/Behavioral: Negative for behavioral problems.      Allergies  Review of patient's allergies indicates no known allergies.  Home Medications   Prior to Admission medications   Medication Sig Start Date End Date Taking? Authorizing Provider  efavirenz-emtricitabine-tenofovir (ATRIPLA) 600-200-300 MG per tablet Take 1 tablet by mouth at bedtime. 04/26/14  Yes Randall Hissornelius N Van Dam, MD  amoxicillin-clavulanate (AUGMENTIN) 500-125 MG tablet Take 1 tablet (500 mg total) by mouth every 8 (eight) hours. 08/14/15   Benjiman CoreNathan Osmond Steckman, MD  fluconazole (DIFLUCAN) 100 MG tablet Take 1 tablet (100 mg total) by mouth daily. Patient not taking: Reported on 08/14/2015 03/04/14   Randall Hissornelius N Van Dam, MD   BP 141/96 mmHg  Pulse 75  Temp(Src) 98.3 F (36.8 C) (Oral)  Resp 16  Ht 5\' 6"  (1.676 m)  Wt 175 lb (79.379 kg)  BMI 28.26 kg/m2  SpO2 100% Physical Exam  Constitutional: He appears well-developed.  HENT:  Head: Atraumatic.  Eyes: EOM are normal.  Neck: Neck supple.  Cardiovascular: Normal rate.   Pulmonary/Chest: Effort normal.  Abdominal: There is tenderness.  Mild upper abdominal tenderness without  rebound or guarding.  Genitourinary:  Mild pain on left side of rectum. Externally there is a small area that could be the exit of the fistula or abscess. No fluctuance. There is some purulent drainage. No erythema. No gross blood.  Musculoskeletal: Normal range of motion.  Neurological: He is alert.  Skin: Skin is warm.    ED Course  Procedures (including critical care time) Labs Review Labs  Reviewed  COMPREHENSIVE METABOLIC PANEL - Abnormal; Notable for the following:    ALT 16 (*)    Total Bilirubin 0.1 (*)    All other components within normal limits  CBC - Abnormal; Notable for the following:    Hemoglobin 12.3 (*)    HCT 37.3 (*)    All other components within normal limits  POC OCCULT BLOOD, ED  POC OCCULT BLOOD, ED  TYPE AND SCREEN  ABO/RH    Imaging Review No results found. I have personally reviewed and evaluated these images and lab results as part of my medical decision-making.   EKG Interpretation None      MDM   Final diagnoses:  Gastrointestinal hemorrhage, unspecified gastritis, unspecified gastrointestinal hemorrhage type  Perianal abscess    Patient with rectal pain and possible fistula. Also reported GI bleeding. Hemoglobin is reassuring. Will give antibiotics and have follow-up with GI. Does not appear to have a drainable abscess at this time.    Benjiman Core, MD 08/15/15 0003

## 2015-08-14 NOTE — Discharge Instructions (Signed)
Gastrointestinal Bleeding Gastrointestinal (GI) bleeding means there is bleeding somewhere along the digestive tract, between the mouth and anus. CAUSES  There are many different problems that can cause GI bleeding. Possible causes include:  Esophagitis. This is inflammation, irritation, or swelling of the esophagus.  Hemorrhoids.These are veins that are full of blood (engorged) in the rectum. They cause pain, inflammation, and may bleed.  Anal fissures.These are areas of painful tearing which may bleed. They are often caused by passing hard stool.  Diverticulosis.These are pouches that form on the colon over time, with age, and may bleed significantly.  Diverticulitis.This is inflammation in areas with diverticulosis. It can cause pain, fever, and bloody stools, although bleeding is rare.  Polyps and cancer. Colon cancer often starts out as precancerous polyps.  Gastritis and ulcers.Bleeding from the upper gastrointestinal tract (near the stomach) may travel through the intestines and produce black, sometimes tarry, often bad smelling stools. In certain cases, if the bleeding is fast enough, the stools may not be black, but red. This condition may be life-threatening. SYMPTOMS   Vomiting bright red blood or material that looks like coffee grounds.  Bloody, black, or tarry stools. DIAGNOSIS  Your caregiver may diagnose your condition by taking your history and performing a physical exam. More tests may be needed, including:  X-rays and other imaging tests.  Esophagogastroduodenoscopy (EGD). This test uses a flexible, lighted tube to look at your esophagus, stomach, and small intestine.  Colonoscopy. This test uses a flexible, lighted tube to look at your colon. TREATMENT  Treatment depends on the cause of your bleeding.   For bleeding from the esophagus, stomach, small intestine, or colon, the caregiver doing your EGD or colonoscopy may be able to stop the bleeding as part of  the procedure.  Inflammation or infection of the colon can be treated with medicines.  Many rectal problems can be treated with creams, suppositories, or warm baths.  Surgery is sometimes needed.  Blood transfusions are sometimes needed if you have lost a lot of blood. If bleeding is slow, you may be allowed to go home. If there is a lot of bleeding, you will need to stay in the hospital for observation. HOME CARE INSTRUCTIONS   Take any medicines exactly as prescribed.  Keep your stools soft by eating foods that are high in fiber. These foods include whole grains, legumes, fruits, and vegetables. Prunes (1 to 3 a day) work well for many people.  Drink enough fluids to keep your urine clear or pale yellow. SEEK IMMEDIATE MEDICAL CARE IF:   Your bleeding increases.  You feel lightheaded, weak, or you faint.  You have severe cramps in your back or abdomen.  You pass large blood clots in your stool.  Your problems are getting worse. MAKE SURE YOU:   Understand these instructions.  Will watch your condition.  Will get help right away if you are not doing well or get worse.   This information is not intended to replace advice given to you by your health care provider. Make sure you discuss any questions you have with your health care provider.   Document Released: 01/20/2000 Document Revised: 01/09/2012 Document Reviewed: 07/12/2014 Elsevier Interactive Patient Education 2016 Elsevier Inc.  Perianal Abscess An abscess is an infected area that contains a collection of pus and debris. A perianal abscess is one that occurs in the perineal area, which is the area between the anus and the scrotum in males and between the anus and the vagina  in females. Perianal abscesses can vary in size. Without treatment, a perianal abscess can become larger and cause other problems. CAUSES  Glands in the perineal area can become plugged up with debris. When this happens, an abscess may form.    SIGNS AND SYMPTOMS  The most common symptoms of a perianal abscess are:  Swelling and redness in the area of the abscess. The redness may go beyond the abscess and appear as a red streak on the skin.  Pain in the area of the abscess. Other possible symptoms include:   A visible lump or a lump that can be felt when touching the area and is usually painful.  Bleeding or pus-like discharge from the area.  Fever.  General weakness. DIAGNOSIS  Your health care provider will take a medical history and examine the area. This may involve examining the rectal area with a gloved hand (digital rectal exam). For women, it may require a careful vaginal exam. Sometimes, the health care provider needs to look into the rectum using a probe or scope. TREATMENT  Treatment often requires making a cut (incision) in the abscess to drain the pus. This can sometimes be done in your health care provider's office or an emergency department after giving you medicine to numb the area (local anesthetic). For larger or deeper abscesses, surgery may be required to drain the abscess. Antibiotic medicines are sometimes given if there is infection of the surrounding tissue (cellulitis). In some cases, gauze is packed into the abscess to continue draining the area. Frequent sitz baths may be recommended to help the wound heal and to reduce the chance of the abscess coming back. HOME CARE INSTRUCTIONS   Only take over-the-counter or prescription medicines for pain, fever, or discomfort as directed by your health care provider.  Take antibiotic medicine as directed. Make sure you finish it even if you start to feel better.  If gauze is used in the abscess, follow your health care provider's instructions for removing or changing the gauze. It can usually be removed in 2-3 days.  If one or more drains have been placed in the abscess cavity, be careful not to pull at them. Your health care provider will tell you how long they  need to remain in place.  Take warm sitz baths 3-4 times a day and after bowel movements. This will help reduce pain and swelling.  Keep the skin around the abscess clean and dry. Avoid cleaning the area too much.  Avoid scratching the abscess area.  Avoid using colored or perfumed toilet papers. SEEK MEDICAL CARE IF:   You have trouble having a bowel movement or passing urine.  Your pain or swelling in the affected area does not seem to be improving.  The gauze packing or the drains come out before the planned time. SEEK IMMEDIATE MEDICAL CARE IF:   You have problems moving or using your legs.  You have severe or increasing pain.  Your swelling in the affected area suddenly gets worse.  You have a large increase in bleeding or passing of pus.  You have chills or a fever. MAKE SURE YOU:   Understand these instructions.  Will watch your condition.  Will get help right away if you are not doing well or get worse.   This information is not intended to replace advice given to you by your health care provider. Make sure you discuss any questions you have with your health care provider.   Document Released: 02/28/2006 Document Revised:  11/12/2012 Document Reviewed: 09/03/2012 Elsevier Interactive Patient Education [employer]2016 Elsevier Inc.

## 2015-08-17 ENCOUNTER — Encounter: Payer: Self-pay | Admitting: Gastroenterology

## 2015-10-06 ENCOUNTER — Ambulatory Visit: Payer: BLUE CROSS/BLUE SHIELD

## 2015-10-18 ENCOUNTER — Other Ambulatory Visit: Payer: BLUE CROSS/BLUE SHIELD

## 2015-10-21 ENCOUNTER — Encounter: Payer: Self-pay | Admitting: Gastroenterology

## 2015-10-21 ENCOUNTER — Ambulatory Visit (INDEPENDENT_AMBULATORY_CARE_PROVIDER_SITE_OTHER): Payer: BLUE CROSS/BLUE SHIELD | Admitting: Gastroenterology

## 2015-10-21 VITALS — BP 134/84 | HR 78 | Ht 66.0 in | Wt 177.0 lb

## 2015-10-21 DIAGNOSIS — K625 Hemorrhage of anus and rectum: Secondary | ICD-10-CM | POA: Diagnosis not present

## 2015-10-21 DIAGNOSIS — R1032 Left lower quadrant pain: Secondary | ICD-10-CM

## 2015-10-21 DIAGNOSIS — K629 Disease of anus and rectum, unspecified: Secondary | ICD-10-CM

## 2015-10-21 MED ORDER — METRONIDAZOLE 500 MG PO TABS: 500.0000 mg | ORAL_TABLET | Freq: Three times a day (TID) | ORAL | 0 refills | Status: AC

## 2015-10-21 MED ORDER — NA SULFATE-K SULFATE-MG SULF 17.5-3.13-1.6 GM/177ML PO SOLN: 1.0000 | Freq: Once | ORAL | 0 refills | Status: AC

## 2015-10-21 NOTE — Patient Instructions (Signed)
If you are age 27 or older, your body mass index should be between 23-30. Your Body mass index is 28.57 kg/m. If this is out of the aforementioned range listed, please consider follow up with your Primary Care Provider.  If you are age 27 or younger, your body mass index should be between 19-25. Your Body mass index is 28.57 kg/m. If this is out of the aformentioned range listed, please consider follow up with your Primary Care Provider.   You have been scheduled for a colonoscopy. Please follow written instructions given to you at your visit today.  Please pick up your prep supplies at the pharmacy within the next 1-3 days. If you use inhalers (even only as needed), please bring them with you on the day of your procedure. Your physician has requested that you go to www.startemmi.com and enter the access code given to you at your visit today. This web site gives a general overview about your procedure. However, you should still follow specific instructions given to you by our office regarding your preparation for the procedure.  We have sent the following medications to your pharmacy for you to pick up at your convenience: Suprep  Thank you for choosing [facility]  Dr Amada JupiterHenry Danis III

## 2015-10-21 NOTE — Progress Notes (Signed)
[facility] Consult Note:  History: Patrick Reilly 10/21/2015  Referring physician: No PCP Per Patient  Reason for consult/chief complaint: GI Bleeding (Follow up from ED visit) and Rectal Bleeding (Rectal bleeding continues)   Subjective  HPI:  Seen in ED early July for rectal bleeding and ? Perianal abscess (given Abx) Was having worsening lower abd pain for about a week, escalating rectal bleeding.  Mostly constipation , some diarrhea as well during that times. Abd pain and bleeding resolved.  Draining peri-anal lesion seemed to resolve after Abx, but now recurred. Denies eye redness, arthralgias or rash or mouth sores.  ROS:  Review of Systems  Constitutional: Negative for appetite change and unexpected weight change.  HENT: Negative for mouth sores and voice change.   Eyes: Negative for pain and redness.  Respiratory: Negative for cough and shortness of breath.   Cardiovascular: Negative for chest pain and palpitations.  Genitourinary: Negative for dysuria and hematuria.  Musculoskeletal: Negative for arthralgias and myalgias.  Skin: Negative for pallor and rash.  Neurological: Negative for weakness and headaches.  Hematological: Negative for adenopathy.     Past Medical History: Past Medical History:  Diagnosis Date  . HIV infection (HCC)   . Hypertension      Past Surgical History: No past surgical history on file.   Family History: History reviewed. No pertinent family history. No known Crohn's or colitis No known CRC Social History: Social History   Social History  . Marital status: Single    Spouse name: N/A  . Number of children: N/A  . Years of education: N/A   Social History Main Topics  . Smoking status: Current Some Day Smoker    Packs/day: 0.10    Types: Cigarettes  . Smokeless tobacco: Never Used     Comment: only smokes when drinking  . Alcohol use 1.2 oz/week    2 Standard drinks or equivalent per week     Comment: wine,  shots, beer on weekends  . Drug use: No     Comment: stopped 3 weeks  . Sexual activity: Yes    Partners: Male     Comment: accepted condoms   Other Topics Concern  . None   Social History Narrative  . None    Allergies: No Known Allergies  Outpatient Meds: No current outpatient prescriptions on file.   No current facility-administered medications for this visit.    Reports CD 4 count "good".  Last viral load in Epic from 02/2014 - undetected   ___________________________________________________________________ Objective   Exam:  BP 134/84 (BP Location: Left Arm, Patient Position: Sitting, Cuff Size: Large)   Pulse 78   Ht 5\' 6"  (1.676 m)   Wt 177 lb (80.3 kg)   SpO2 99%   BMI 28.57 kg/m    General: this is a(n) well-appearing young man   Eyes: sclera anicteric, no redness  ENT: oral mucosa moist without lesions, no cervical or supraclavicular lymphadenopathy, good dentition  CV: RRR without murmur, S1/S2, no JVD, no peripheral edema  Resp: clear to auscultation bilaterally, normal RR and effort noted  GI: soft,mild LLQ tenderness, with active bowel sounds. No guarding or palpable organomegaly noted.  Skin; warm and dry, no rash or jaundice noted  Neuro: awake, alert and oriented x 3. Normal gross motor function and fluent speech Rectal: small opening 0.5 cm from posterior anal canal - 5 o clock position. Tiny firm area as if previous scarring.  Nontender, no drainage No anal fissure, no palpable internal lesions  Labs:  CBC    Component Value Date/Time   WBC 5.0 08/14/2015 1404   RBC 4.56 08/14/2015 1404   HGB 12.3 (L) 08/14/2015 1404   HCT 37.3 (L) 08/14/2015 1404   PLT 222 08/14/2015 1404   MCV 81.8 08/14/2015 1404   MCH 27.0 08/14/2015 1404   MCHC 33.0 08/14/2015 1404   RDW 14.1 08/14/2015 1404   LYMPHSABS 2.3 02/23/2014 1350   MONOABS 0.3 02/23/2014 1350   EOSABS 0.0 02/23/2014 1350   BASOSABS 0.0 02/23/2014 1350   CMP Latest Ref Rng &  Units 08/14/2015 02/23/2014 11/09/2013  Glucose 65 - 99 mg/dL 90 81 84  BUN 6 - 20 mg/dL 11 12 10   Creatinine 0.61 - 1.24 mg/dL 1.61 0.96 0.45  Sodium 135 - 145 mmol/L 138 135 139  Potassium 3.5 - 5.1 mmol/L 3.6 3.9 4.0  Chloride 101 - 111 mmol/L 106 103 103  CO2 22 - 32 mmol/L 25 26 29   Calcium 8.9 - 10.3 mg/dL 9.0 9.2 9.0  Total Protein 6.5 - 8.1 g/dL 8.0 7.7 7.3  Total Bilirubin 0.3 - 1.2 mg/dL 4.0(J) 0.3 0.2  Alkaline Phos 38 - 126 U/L 68 66 65  AST 15 - 41 U/L 20 22 24   ALT 17 - 63 U/L 16(L) 27 30   Assessment: Encounter Diagnoses  Name Primary?  . Perianal lesion Yes  . Rectal bleeding   . Abdominal pain, left lower quadrant     Unclear if peri-anal lesion is localized or a fistula. Ongoing pain, bleeding resolved.  Must rule out Crohn's  Plan:  Colonoscopy to ileum  The benefits and risks of the planned procedure were described in detail with the patient or (when appropriate) their health care proxy.  Risks were outlined as including, but not limited to, bleeding, infection, perforation, adverse medication reaction leading to cardiac or pulmonary decompensation, or pancreatitis (if ERCP).  The limitation of incomplete mucosal visualization was also discussed.  No guarantees or warranties were given.  If negative, refer to CR surgery to see if needs further exploration. Not clear if active infection.  Will treat with 7 days flagyl to see if any improvement.  No alcohol with flagyl.  Thank you for the courtesy of this consult.  Please call me with any questions or concerns.  Charlie Pitter III  CC: No PCP Per Patient

## 2015-11-01 ENCOUNTER — Other Ambulatory Visit (HOSPITAL_COMMUNITY)
Admission: RE | Admit: 2015-11-01 | Discharge: 2015-11-01 | Disposition: A | Discharge status: Home / Self Care | Payer: BLUE CROSS/BLUE SHIELD | Source: Ambulatory Visit | Attending: Infectious Disease | Admitting: Infectious Disease

## 2015-11-01 ENCOUNTER — Ambulatory Visit (INDEPENDENT_AMBULATORY_CARE_PROVIDER_SITE_OTHER): Payer: BLUE CROSS/BLUE SHIELD | Admitting: Infectious Disease

## 2015-11-01 ENCOUNTER — Encounter: Payer: Self-pay | Admitting: Infectious Disease

## 2015-11-01 VITALS — BP 146/95 | HR 95 | Temp 98.7°F | Ht 66.0 in | Wt 178.0 lb

## 2015-11-01 DIAGNOSIS — Z113 Encounter for screening for infections with a predominantly sexual mode of transmission: Secondary | ICD-10-CM | POA: Insufficient documentation

## 2015-11-01 DIAGNOSIS — B20 Human immunodeficiency virus [HIV] disease: Secondary | ICD-10-CM

## 2015-11-01 DIAGNOSIS — I10 Essential (primary) hypertension: Secondary | ICD-10-CM

## 2015-11-01 DIAGNOSIS — F329 Major depressive disorder, single episode, unspecified: Secondary | ICD-10-CM | POA: Diagnosis not present

## 2015-11-01 DIAGNOSIS — F32A Depression, unspecified: Secondary | ICD-10-CM

## 2015-11-01 DIAGNOSIS — F1290 Cannabis use, unspecified, uncomplicated: Secondary | ICD-10-CM

## 2015-11-01 HISTORY — DX: Essential (primary) hypertension: I10

## 2015-11-01 LAB — CBC WITH DIFFERENTIAL/PLATELET
Basophils Absolute: 0 cells/uL (ref 0–200)
Basophils Relative: 0 %
EOS PCT: 7 %
Eosinophils Absolute: 266 cells/uL (ref 15–500)
HEMATOCRIT: 40.9 % (ref 38.5–50.0)
Hemoglobin: 13.4 g/dL (ref 13.2–17.1)
LYMPHS ABS: 1748 {cells}/uL (ref 850–3900)
LYMPHS PCT: 46 %
MCH: 26.6 pg — ABNORMAL LOW (ref 27.0–33.0)
MCHC: 32.8 g/dL (ref 32.0–36.0)
MCV: 81.2 fL (ref 80.0–100.0)
MONOS PCT: 15 %
MPV: 9.4 fL (ref 7.5–12.5)
Monocytes Absolute: 570 cells/uL (ref 200–950)
NEUTROS PCT: 32 %
Neutro Abs: 1216 cells/uL — ABNORMAL LOW (ref 1500–7800)
Platelets: 154 10*3/uL (ref 140–400)
RBC: 5.04 MIL/uL (ref 4.20–5.80)
RDW: 15.1 % — AB (ref 11.0–15.0)
WBC: 3.8 10*3/uL (ref 3.8–10.8)

## 2015-11-01 LAB — COMPLETE METABOLIC PANEL WITH GFR
ALBUMIN: 4.1 g/dL (ref 3.6–5.1)
ALK PHOS: 54 U/L (ref 40–115)
ALT: 22 U/L (ref 9–46)
AST: 22 U/L (ref 10–40)
BUN: 13 mg/dL (ref 7–25)
CALCIUM: 9.1 mg/dL (ref 8.6–10.3)
CO2: 25 mmol/L (ref 20–31)
Chloride: 107 mmol/L (ref 98–110)
Creat: 0.99 mg/dL (ref 0.60–1.35)
GFR, Est African American: >89 mL/min (ref >=60)
GFR, Est Non African American: >89 mL/min (ref >=60)
Glucose, Bld: 63 mg/dL — ABNORMAL LOW (ref 65–99)
POTASSIUM: 3.7 mmol/L (ref 3.5–5.3)
Sodium: 138 mmol/L (ref 135–146)
Total Bilirubin: 0.3 mg/dL (ref 0.2–1.2)
Total Protein: 7.9 g/dL (ref 6.1–8.1)

## 2015-11-01 MED ORDER — EMTRICITABINE-TENOFOVIR AF 200-25 MG PO TABS: 1.0000 | ORAL_TABLET | Freq: Every day | ORAL | 11 refills | Status: DC

## 2015-11-01 MED ORDER — DOLUTEGRAVIR SODIUM 50 MG PO TABS: 50.0000 mg | ORAL_TABLET | Freq: Every day | ORAL | 11 refills | Status: DC

## 2015-11-01 NOTE — Progress Notes (Signed)
Subjective:    Patient ID: Patrick Reilly, male    DOB: [birth year], 25 y.o.   MRN: [id number]  HPI  25 year old African [place] man who is perfectly suppressed on his antiretroviral regimen of Atripla but who has not been seen by me for nearly 1.5 years. He claims that he has been out of meds fort he past 1-2 months.  Lab Results  Component Value Date   HIV1RNAQUANT <20 02/23/2014   HIV1RNAQUANT <20 11/09/2013   HIV1RNAQUANT 32 (H) 02/10/2013     Lab Results  Component Value Date   CD4TABS 690 02/23/2014   CD4TABS 790 11/09/2013   CD4TABS 600 02/10/2013   We discussed more modern therapies and he was interested in [facility] but I do not have his HLA B701 status.   In the interim we will go with [facility] and DESCOVY with copay cards via [facility].   Past Medical History:  Diagnosis Date  . HIV infection ([place])   . Hypertension     No past surgical history on file.  No family history on file.    Social History   Social History  . Marital status: Single    Spouse name: N/A  . Number of children: N/A  . Years of education: N/A   Social History Main Topics  . Smoking status: Current Some Day Smoker    Packs/day: 0.10    Types: Cigarettes  . Smokeless tobacco: Never Used     Comment: only smokes when drinking  . Alcohol use 4.2 oz/week    7 Standard drinks or equivalent per week     Comment: wine, shots, beer on weekends  . Drug use: No     Comment: stopped 3 weeks  . Sexual activity: Yes    Partners: Male     Comment: accepted condoms   Other Topics Concern  . None   Social History Narrative  . None    No Known Allergies   Current Outpatient Prescriptions:  .  betamethasone valerate (VALISONE) 0.1 % cream, APPLY ONE APPLICATION Q 12 H PRN, Disp: , Rfl: 0 .  dolutegravir (TIVICAY) 50 MG tablet, Take 1 tablet (50 mg total) by mouth daily., Disp: 30 tablet, Rfl: 11 .  emtricitabine-tenofovir AF (DESCOVY) 200-25 MG tablet, Take 1 tablet  by mouth daily., Disp: 30 tablet, Rfl: 11 .  SUPREP BOWEL PREP KIT 17.5-3.13-1.6 GM/180ML SOLN, See admin instructions., Disp: , Rfl: 0    Review of Systems  Constitutional: Negative for activity change, appetite change, chills, diaphoresis, fatigue, fever and unexpected weight change.  HENT: Negative for congestion, rhinorrhea, sinus pressure, sneezing, sore throat and trouble swallowing.   Eyes: Negative for photophobia and visual disturbance.  Respiratory: Negative for cough, chest tightness, shortness of breath, wheezing and stridor.   Cardiovascular: Negative for chest pain, palpitations and leg swelling.  Gastrointestinal: Negative for abdominal distention, abdominal pain, anal bleeding, blood in stool, constipation, diarrhea, nausea and vomiting.  Genitourinary: Negative for difficulty urinating, dysuria, flank pain and hematuria.  Musculoskeletal: Negative for arthralgias, back pain, gait problem, joint swelling and myalgias.  Skin: Negative for color change, pallor, rash and wound.  Neurological: Negative for dizziness, tremors, weakness and light-headedness.  Hematological: Negative for adenopathy. Does not bruise/bleed easily.  Psychiatric/Behavioral: Negative for agitation, behavioral problems, confusion, decreased concentration and dysphoric mood.       Objective:   Physical Exam  Constitutional: He is oriented to person, place, and time. He appears well-developed and well-nourished. No distress.  HENT:  Head: Normocephalic and atraumatic.  Mouth/Throat: Oropharynx is clear and moist. No oropharyngeal exudate.  Eyes: Conjunctivae and EOM are normal. No scleral icterus.  Neck: Normal range of motion. Neck supple. No JVD present.  Cardiovascular: Normal rate and regular rhythm.   Pulmonary/Chest: Effort normal. No respiratory distress. He has no wheezes.  Abdominal: He exhibits no distension.  Musculoskeletal: He exhibits no edema or tenderness.  Lymphadenopathy:    He  has no cervical adenopathy.  Neurological: He is alert and oriented to person, place, and time. He exhibits normal muscle tone. Coordination normal.  Skin: Skin is warm and dry. He is not diaphoretic. No erythema. No pallor.  Psychiatric: He has a normal mood and affect. His behavior is normal. Judgment and thought content normal.          Assessment & Plan:   HIV: change to Tivicay and Descovy  HTN: not well controlled at this point and needing to starting a medicine future Vitals:   11/01/15 1536  BP: (!) 146/95  Pulse: 95  Temp: 98.7 F (37.1 C)   Depression: he claims not to be actively depressed  I spent greater than 25 minutes with the patient including greater than 50% of time in face to face counsel of the patient and in coordination of their care.

## 2015-11-01 NOTE — Patient Instructions (Signed)
We will check blood work today  I  would like to switch your new meds Tivicay and Descovy  to [facility]  Can you also meet with Cassie in 2 weeks after being on your new regimen?  We will repeat blood work in a month  And then please see me in roughly 6 weeks

## 2015-11-02 LAB — FLUORESCENT TREPONEMAL AB(FTA)-IGG-BLD: FLUORESCENT TREPONEMAL ABS: REACTIVE — AB

## 2015-11-02 LAB — RPR: RPR Ser Ql: REACTIVE — AB

## 2015-11-02 LAB — RPR TITER

## 2015-11-03 LAB — URINE CYTOLOGY ANCILLARY ONLY
CHLAMYDIA, DNA PROBE: NEGATIVE
Neisseria Gonorrhea: NEGATIVE

## 2015-11-03 LAB — T-HELPER CELL (CD4) - (RCID CLINIC ONLY)
CD4 % Helper T Cell: 29 % — ABNORMAL LOW (ref 33–55)
CD4 T CELL ABS: 520 /uL (ref 400–2700)

## 2015-11-03 LAB — HIV-1 RNA ULTRAQUANT REFLEX TO GENTYP+
HIV 1 RNA QUANT: 2949 {copies}/mL — AB (ref <20)
HIV-1 RNA Quant, Log: 3.47 Log copies/mL — ABNORMAL HIGH (ref <1.30)

## 2015-11-10 LAB — HLA B*5701: HLA-B 5701 W/RFLX HLA-B HIGH: NEGATIVE

## 2015-11-11 LAB — HIV-1 GENOTYPR PLUS

## 2015-11-22 ENCOUNTER — Ambulatory Visit: Payer: BLUE CROSS/BLUE SHIELD | Admitting: Pharmacist

## 2015-11-22 DIAGNOSIS — B20 Human immunodeficiency virus [HIV] disease: Secondary | ICD-10-CM

## 2015-11-22 MED ORDER — ABACAVIR-DOLUTEGRAVIR-LAMIVUD 600-50-300 MG PO TABS: 1.0000 | ORAL_TABLET | Freq: Every day | ORAL | 6 refills | Status: DC

## 2015-11-22 NOTE — Progress Notes (Signed)
HPI: Patrick Reilly is a 25 y.o. male who presents to the [facility] today for follow-up of his HIV.  He was recently diagnosed and started on Tivicay and Descovy.   Allergies: No Known Allergies  Past Medical History: Past Medical History:  Diagnosis Date  . HIV infection ([place])   . HTN (hypertension) 11/01/2015  . Hypertension     Social History: Social History   Social History  . Marital status: Single    Spouse name: N/A  . Number of children: N/A  . Years of education: N/A   Social History Main Topics  . Smoking status: Current Some Day Smoker    Packs/day: 0.10    Types: Cigarettes  . Smokeless tobacco: Never Used     Comment: only smokes when drinking  . Alcohol use 4.2 oz/week    7 Standard drinks or equivalent per week     Comment: wine, shots, beer on weekends  . Drug use: No     Comment: stopped 3 weeks  . Sexual activity: Yes    Partners: Male     Comment: accepted condoms   Other Topics Concern  . Not on file   Social History Narrative  . No narrative on file    Current Regimen: Tivicay + Descovy  Labs: HIV 1 RNA Quant (copies/mL)  Date Value  11/01/2015 2,949 (H)  02/23/2014 <20  11/09/2013 <20   CD4 T Cell Abs (/uL)  Date Value  11/01/2015 520  02/23/2014 690  11/09/2013 790   Hep B S Ab (no units)  Date Value  12/21/2008 POS (A)   Hepatitis B Surface Ag (no units)  Date Value  12/21/2008 NEG   HCV Ab (no units)  Date Value  12/21/2008 NEG    CrCl: CrCl cannot be calculated (Unknown ideal weight.).  Lipids:    Component Value Date/Time   CHOL 96 11/09/2013 1649   TRIG 85 11/09/2013 1649   HDL 40 11/09/2013 1649   CHOLHDL 2.4 11/09/2013 1649   VLDL 17 11/09/2013 1649   LDLCALC 39 11/09/2013 1649    Assessment: Patrick Reilly is here today for follow up after recently being diagnosed with HIV and started on new medications - Tivicay and Descovy.  He is not having any issues with taking Tivicay or Descovy.  He does say  he did have some itching when he first started the medications, but it went away after a few days.  He states he has not missed any doses and remembers to take his medications everyday.  He does say he uses the pillbox keychain I gave him at the last appointment. During his last visit with Dr. Tommy Medal, they discussed starting Triumeq but did not have a HLA test.  His HLA was negative, so I dicussed changing over to Triumeq with him and gave him the option of continuing the two small pills of Tivicay and Descovy or switching over to Triumeq.  He would like to switch to Triumeq as he said it is easier to keep up with. He doesn't have any other issues or questions and says he is doing very well.  Plans: - Stop Tivicay and Descovy - Start Triumeq once daily - F/u with Dr. Tommy Medal 10/31  Cassie L. Westley Gambles, PharmD Infectious [facility] 11/22/2015, 2:26 PM

## 2015-11-30 ENCOUNTER — Encounter: Payer: Self-pay | Admitting: Gastroenterology

## 2015-12-06 ENCOUNTER — Ambulatory Visit: Payer: BLUE CROSS/BLUE SHIELD | Admitting: Infectious Disease

## 2015-12-14 ENCOUNTER — Encounter: Payer: Self-pay | Admitting: Gastroenterology

## 2015-12-14 ENCOUNTER — Ambulatory Visit (AMBULATORY_SURGERY_CENTER): Payer: BLUE CROSS/BLUE SHIELD | Admitting: Gastroenterology

## 2015-12-14 VITALS — BP 115/80 | HR 56 | Temp 98.4°F | Resp 10 | Ht 66.0 in | Wt 177.0 lb

## 2015-12-14 DIAGNOSIS — R1030 Lower abdominal pain, unspecified: Secondary | ICD-10-CM

## 2015-12-14 DIAGNOSIS — K921 Melena: Secondary | ICD-10-CM

## 2015-12-14 DIAGNOSIS — K603 Anal fistula: Secondary | ICD-10-CM | POA: Diagnosis not present

## 2015-12-14 DIAGNOSIS — K629 Disease of anus and rectum, unspecified: Secondary | ICD-10-CM | POA: Diagnosis not present

## 2015-12-14 MED ORDER — SODIUM CHLORIDE 0.9 % IV SOLN: 500.0000 mL | INTRAVENOUS | Status: DC

## 2015-12-14 NOTE — Op Note (Signed)
[facility] Patient Name: Patrick Reilly Procedure Date: 12/14/2015 8:27 AM MRN: [id number] Endoscopist: Sherilyn Cooter L. Myrtie Neither , MD Age: 25 Referring MD:  Date of Birth: 05/17/1990 Gender: Male Account #: [id number] Procedure:                Colonoscopy Indications:              Lower abdominal pain, Anal bleeding, persistent                            peri-anal fistula Medicines:                Monitored Anesthesia Care Procedure:                Pre-Anesthesia Assessment:                           - Prior to the procedure, a History and Physical                            was performed, and patient medications and                            allergies were reviewed. The patient's tolerance of                            previous anesthesia was also reviewed. The risks                            and benefits of the procedure and the sedation                            options and risks were discussed with the patient.                            All questions were answered, and informed consent                            was obtained. Prior Anticoagulants: The patient has                            taken no previous anticoagulant or antiplatelet                            agents. ASA Grade Assessment: III - A patient with                            severe systemic disease. After reviewing the risks                            and benefits, the patient was deemed in                            satisfactory condition to undergo the procedure.  After obtaining informed consent, the colonoscope                            was passed under direct vision. Throughout the                            procedure, the patient's blood pressure, pulse, and                            oxygen saturations were monitored continuously. The                            Model CF-HQ190L [id number](SN#[id number]) scope was introduced                            through the anus and advanced to the 15 cm  into the                            ileum. The colonoscopy was performed without                            difficulty. The patient tolerated the procedure                            well. The quality of the bowel preparation was                            fair. The terminal ileum, ileocecal valve,                            appendiceal orifice, and rectum were photographed.                            The bowel preparation used was SUPREP. Scope In: 8:34:02 AM Scope Out: 8:44:32 AM Scope Withdrawal Time: 0 hours 7 minutes 50 seconds  Total Procedure Duration: 0 hours 10 minutes 30 seconds  Findings:                 The perianal exam findings include perianal fistula                            at the 5 o'clock position.                           The terminal ileum appeared normal to a distance of                            15 cm insertion.                           The colon (entire examined portion) appeared normal.                           The anal canal was edematous and erythematous, but  without apparent fissure or fistulous opening.                           Retroflexion in the rectum was not performed due to                            anatomy. Complications:            No immediate complications. Estimated Blood Loss:     Estimated blood loss: none. Impression:               - Preparation of the colon was fair.                           - Perianal fistula found on perianal exam.                           - The examined portion of the ileum was normal.                           - The entire examined colon is normal. Anal                            findings as above.                           - No specimens collected. Recommendation:           - Patient has a contact number available for                            emergencies. The signs and symptoms of potential                            delayed complications were discussed with the                             patient. Return to normal activities tomorrow.                            Written discharge instructions were provided to the                            patient.                           - Resume previous diet.                           - Continue present medications.                           - No recommendation at this time regarding repeat                            colonoscopy due to young age.                           -  Referral to Colorectal Surgery to evaluate                            fistula.                           While awaiting surgical evaluation, I will                            communicate with [facility] regarding appropriate                            abdominal /pelvic imaging (CT vs MR) Sherilyn CooterHenry L. Myrtie Neitheranis, MD 12/14/2015 8:53:18 AM This report has been signed electronically.

## 2015-12-14 NOTE — Patient Instructions (Signed)
Referral to Colorectal Surgery to evaluate fistula. Office will call you with that appointment.  Resume current medications. Call us with any questions or concerns. Thank you!   YOU HAD AN ENDOSCOPIC PROCEDURE TODAY AT THE [facility]:   Refer to the procedure report that was given to you for any specific questions about what was found during the examination.  If the procedure report does not answer your questions, please call your gastroenterologist to clarify.  If you requested that your care partner not be given the details of your procedure findings, then the procedure report has been included in a sealed envelope for you to review at your convenience later.  YOU SHOULD EXPECT: Some feelings of bloating in the abdomen. Passage of more gas than usual.  Walking can help get rid of the air that was put into your GI tract during the procedure and reduce the bloating. If you had a lower endoscopy (such as a colonoscopy or flexible sigmoidoscopy) you may notice spotting of blood in your stool or on the toilet paper. If you underwent a bowel prep for your procedure, you may not have a normal bowel movement for a few days.  Please Note:  You might notice some irritation and congestion in your nose or some drainage.  This is from the oxygen used during your procedure.  There is no need for concern and it should clear up in a day or so.  SYMPTOMS TO REPORT IMMEDIATELY:   Following lower endoscopy (colonoscopy or flexible sigmoidoscopy):  Excessive amounts of blood in the stool  Significant tenderness or worsening of abdominal pains  Swelling of the abdomen that is new, acute  Fever of 100F or higher   For urgent or emergent issues, a gastroenterologist can be reached at any hour by calling [phone].   DIET:  We do recommend a small meal at first, but then you may proceed to your regular diet.  Drink plenty of fluids but you should avoid alcoholic beverages for 24  hours.  ACTIVITY:  You should plan to take it easy for the rest of today and you should NOT DRIVE or use heavy machinery until tomorrow (because of the sedation medicines used during the test).    FOLLOW UP: Our staff will call the number listed on your records the next business day following your procedure to check on you and address any questions or concerns that you may have regarding the information given to you following your procedure. If we do not reach you, we will leave a message.  However, if you are feeling well and you are not experiencing any problems, there is no need to return our call.  We will assume that you have returned to your regular daily activities without incident.  If any biopsies were taken you will be contacted by phone or by letter within the next 1-3 weeks.  Please call us at [phone] if you have not heard about the biopsies in 3 weeks.    SIGNATURES/CONFIDENTIALITY: You and/or your care partner have signed paperwork which will be entered into your electronic medical record.  These signatures attest to the fact that that the information above on your After Visit Summary has been reviewed and is understood.  Full responsibility of the confidentiality of this discharge information lies with you and/or your care-partner.

## 2015-12-14 NOTE — Progress Notes (Signed)
Report to PACU, RN, vss, BBS= Clear.  

## 2015-12-15 ENCOUNTER — Telehealth: Payer: Self-pay | Admitting: *Deleted

## 2015-12-15 NOTE — Telephone Encounter (Signed)
  Follow up Call-  Call back number 12/14/2015  Post procedure Call Back phone  # [phone]  Permission to leave phone message Yes  Some recent data might be hidden     Patient questions:  Do you have a fever, pain , or abdominal swelling? No. Pain Score  0 *  Have you tolerated food without any problems? Yes.    Have you been able to return to your normal activities? Yes.    Do you have any questions about your discharge instructions: Diet   No. Medications  No. Follow up visit  No.  Do you have questions or concerns about your Care? Yes.    Actions: * If pain score is 4 or above: No action needed, pain <4.

## 2015-12-16 ENCOUNTER — Other Ambulatory Visit: Payer: Self-pay

## 2015-12-16 ENCOUNTER — Telehealth: Payer: Self-pay | Admitting: Gastroenterology

## 2015-12-16 DIAGNOSIS — K625 Hemorrhage of anus and rectum: Secondary | ICD-10-CM

## 2015-12-16 DIAGNOSIS — K603 Anal fistula: Secondary | ICD-10-CM

## 2015-12-16 DIAGNOSIS — R1030 Lower abdominal pain, unspecified: Secondary | ICD-10-CM

## 2015-12-16 NOTE — Telephone Encounter (Signed)
Records faxed to [facility] for eval and treat for perianal fistula. Pt has also been scheduled for a CT scan of the abd/pelvis on 12-20-2015 @ 915 am. Pt has been notified and aware of all the above. He will come to the clinic to pick up his oral contrast and instructions for his CT.

## 2015-12-16 NOTE — Telephone Encounter (Signed)
Sheralyn Boatmanoni,    I did a colonoscopy on this young man earlier this week.  1) Please refer him to Dr Romie LeveeAlicia Thomas of [facility] for peri-anal fistula  2) please schedule him for a CT Abdomen/pelvis with oral and IV contrast for lower abdominal pain, rule out Crohn's disease  He is aware that these things will happen.

## 2015-12-20 ENCOUNTER — Ambulatory Visit (INDEPENDENT_AMBULATORY_CARE_PROVIDER_SITE_OTHER)
Admission: RE | Admit: 2015-12-20 | Discharge: 2015-12-20 | Disposition: A | Discharge status: Home / Self Care | Payer: BLUE CROSS/BLUE SHIELD | Source: Ambulatory Visit | Attending: Gastroenterology | Admitting: Gastroenterology

## 2015-12-20 DIAGNOSIS — R1030 Lower abdominal pain, unspecified: Secondary | ICD-10-CM

## 2015-12-20 DIAGNOSIS — K603 Anal fistula: Secondary | ICD-10-CM

## 2015-12-20 DIAGNOSIS — K625 Hemorrhage of anus and rectum: Secondary | ICD-10-CM

## 2015-12-20 MED ORDER — IOPAMIDOL (ISOVUE-300) INJECTION 61%
100.0000 mL | Freq: Once | INTRAVENOUS | Status: AC | PRN
  Administered 2015-12-20: 100 mL via INTRAVENOUS

## 2015-12-20 NOTE — Telephone Encounter (Signed)
Pt has been scheduled with Romie LeveeAlicia Thomas, MD on 12-27-2015 at 9am. Pt is already aware.

## 2015-12-21 ENCOUNTER — Inpatient Hospital Stay: Admission: RE | Admit: 2015-12-21 | Payer: BLUE CROSS/BLUE SHIELD | Source: Ambulatory Visit

## 2015-12-27 ENCOUNTER — Other Ambulatory Visit: Payer: Self-pay | Admitting: General Surgery

## 2015-12-27 NOTE — H&P (Signed)
History of Present Illness Patrick Reilly(Patrick Kinn MD; 12/27/2015 9:26 AM) The patient is a 25 year old male who presents with a complaint of Fistula. 25 year old male who presents to the office for evaluation of perianal drainage. He states for the last 2 years he has had intermittent bloody and purulent perianal drainage. He has minimal pain from the area. This all started when he developed an abscess. He denies any chronic diarrhea.   Other Problems Doristine Devoid(Chemira Jones, CMA; 12/27/2015 9:28 AM) No pertinent past medical history  Past Surgical History Doristine Devoid(Chemira Jones, CMA; 12/27/2015 9:28 AM) No pertinent past surgical history  Diagnostic Studies History Doristine Devoid(Chemira Jones, CMA; 12/27/2015 9:28 AM) Colonoscopy within last year  Allergies Timmothy Euler(Sade Bradford, CMA; 12/27/2015 9:13 AM) No Known Drug Allergies 12/27/2015  Medication History Timmothy Euler(Sade Bradford, CMA; 12/27/2015 9:14 AM) Nils Pyleriumeq (600-50-300MG  Tablet, Oral daily) Active. Medications Reconciled  Social History Doristine Devoid(Chemira Jones, CMA; 12/27/2015 9:28 AM) Alcohol use Occasional alcohol use. Caffeine use Coffee, Tea. Illicit drug use Uses socially only. Tobacco use Current some day smoker.  Family History Doristine Devoid(Chemira Jones, CMA; 12/27/2015 9:28 AM) Hypertension Mother.     Review of Systems Doristine Devoid(Chemira Jones CMA; 12/27/2015 9:28 AM) General Not Present- Appetite Loss, Chills, Fatigue, Fever, Night Sweats, Weight Gain and Weight Loss. Skin Present- Non-Healing Wounds. Not Present- Change in Wart/Mole, Dryness, Hives, Jaundice, New Lesions, Rash and Ulcer. HEENT Not Present- Earache, Hearing Loss, Hoarseness, Nose Bleed, Oral Ulcers, Ringing in the Ears, Seasonal Allergies, Sinus Pain, Sore Throat, Visual Disturbances, Wears glasses/contact lenses and Yellow Eyes. Respiratory Not Present- Bloody sputum, Chronic Cough, Difficulty Breathing, Snoring and Wheezing. Breast Not Present- Breast Mass, Breast Pain, Nipple Discharge and Skin  Changes. Cardiovascular Not Present- Chest Pain, Difficulty Breathing Lying Down, Leg Cramps, Palpitations, Rapid Heart Rate, Shortness of Breath and Swelling of Extremities. Gastrointestinal Present- Abdominal Pain. Not Present- Bloating, Bloody Stool, Change in Bowel Habits, Chronic diarrhea, Constipation, Difficulty Swallowing, Excessive gas, Gets full quickly at meals, Hemorrhoids, Indigestion, Nausea, Rectal Pain and Vomiting. Male Genitourinary Not Present- Blood in Urine, Change in Urinary Stream, Frequency, Impotence, Nocturia, Painful Urination, Urgency and Urine Leakage. Musculoskeletal Not Present- Back Pain, Joint Pain, Joint Stiffness, Muscle Pain, Muscle Weakness and Swelling of Extremities. Neurological Not Present- Decreased Memory, Fainting, Headaches, Numbness, Seizures, Tingling, Tremor, Trouble walking and Weakness. Psychiatric Not Present- Anxiety, Bipolar, Change in Sleep Pattern, Depression, Fearful and Frequent crying. Endocrine Not Present- Cold Intolerance, Excessive Hunger, Hair Changes, Heat Intolerance, Hot flashes and New Diabetes. Hematology Not Present- Blood Thinners, Easy Bruising, Excessive bleeding, Gland problems, HIV and Persistent Infections.  Vitals Barron Alvine(Sade Bradford CMA; 12/27/2015 9:14 AM) 12/27/2015 9:14 AM Weight: 180.4 lb Height: 66in Body Surface Area: 1.91 m Body Mass Index: 29.12 kg/m  Temp.: 98.103F  Pulse: 70 (Regular)  BP: 132/82 (Sitting, Left Arm, Standard)      Physical Exam Patrick Reilly(Meia Emley MD; 12/27/2015 9:25 AM)  General Mental Status-Alert. General Appearance-Not in acute distress. Build & Nutrition-Well nourished. Posture-Normal posture. Gait-Normal.  Head and Neck Head-normocephalic, atraumatic with no lesions or palpable masses. Trachea-midline.  Chest and Lung Exam Chest and lung exam reveals -on auscultation, normal breath sounds, no adventitious sounds and normal vocal  resonance.  Cardiovascular Cardiovascular examination reveals -normal heart sounds, regular rate and rhythm with no murmurs and no digital clubbing, cyanosis, edema, increased warmth or tenderness.  Abdomen Inspection Inspection of the abdomen reveals - No Hernias. Palpation/Percussion Palpation and Percussion of the abdomen reveal - Soft, Non Tender, No Rigidity (guarding), No hepatosplenomegaly and No Palpable abdominal  masses.  Rectal Anorectal Exam External - Note: Pinhole defect in left posterior lateral perianal region which expresses purulence.  Neurologic Neurologic evaluation reveals -alert and oriented x 3 with no impairment of recent or remote memory, normal attention span and ability to concentrate, normal sensation and normal coordination.  Musculoskeletal Normal Exam - Bilateral-Upper Extremity Strength Normal and Lower Extremity Strength Normal.    Assessment & Plan Patrick Reilly(Haleigh Desmith MD; 12/27/2015 9:27 AM)  PERIANAL FISTULA (K60.3) Impression: 25 year old male who presents to the office with a perianal fistula. On exam this is in the left posterior lateral perianal space. The scan be expressed. I recommended an exam under anesthesia with possible fistulotomy versus seton placement. We have discussed each option in detail. Risk of procedure include bleeding. There is a small chance of incontinence if the fistulotomy is performed.

## 2016-01-31 ENCOUNTER — Encounter (HOSPITAL_BASED_OUTPATIENT_CLINIC_OR_DEPARTMENT_OTHER): Payer: Self-pay | Admitting: *Deleted

## 2016-01-31 NOTE — Progress Notes (Signed)
Pt instructed npo p mn 12/28.  [facility]  Needs hgb on arrival

## 2016-02-03 ENCOUNTER — Encounter (HOSPITAL_BASED_OUTPATIENT_CLINIC_OR_DEPARTMENT_OTHER): Payer: Self-pay

## 2016-02-03 ENCOUNTER — Encounter (HOSPITAL_BASED_OUTPATIENT_CLINIC_OR_DEPARTMENT_OTHER)
Admission: RE | Disposition: A | Discharge status: Home / Self Care | Payer: Self-pay | Source: Ambulatory Visit | Attending: General Surgery

## 2016-02-03 ENCOUNTER — Ambulatory Visit (HOSPITAL_BASED_OUTPATIENT_CLINIC_OR_DEPARTMENT_OTHER): Payer: BLUE CROSS/BLUE SHIELD | Admitting: Anesthesiology

## 2016-02-03 ENCOUNTER — Ambulatory Visit (HOSPITAL_BASED_OUTPATIENT_CLINIC_OR_DEPARTMENT_OTHER)
Admission: RE | Admit: 2016-02-03 | Discharge: 2016-02-03 | Disposition: A | Discharge status: Home / Self Care | Payer: BLUE CROSS/BLUE SHIELD | Source: Ambulatory Visit | Attending: General Surgery | Admitting: General Surgery

## 2016-02-03 DIAGNOSIS — K603 Anal fistula: Secondary | ICD-10-CM | POA: Diagnosis not present

## 2016-02-03 DIAGNOSIS — I10 Essential (primary) hypertension: Secondary | ICD-10-CM | POA: Diagnosis not present

## 2016-02-03 DIAGNOSIS — B20 Human immunodeficiency virus [HIV] disease: Secondary | ICD-10-CM | POA: Diagnosis not present

## 2016-02-03 DIAGNOSIS — F17200 Nicotine dependence, unspecified, uncomplicated: Secondary | ICD-10-CM | POA: Insufficient documentation

## 2016-02-03 HISTORY — DX: Headache: R51

## 2016-02-03 HISTORY — PX: RECTAL EXAM UNDER ANESTHESIA: SHX6399

## 2016-02-03 HISTORY — PX: ANAL FISTULOTOMY: SHX6423

## 2016-02-03 HISTORY — DX: Rash and other nonspecific skin eruption: R21

## 2016-02-03 HISTORY — DX: Headache, unspecified: R51.9

## 2016-02-03 HISTORY — DX: Presence of spectacles and contact lenses: Z97.3

## 2016-02-03 LAB — POCT HEMOGLOBIN-HEMACUE: Hemoglobin: 13.6 g/dL (ref 13.0–17.0)

## 2016-02-03 SURGERY — EXAM UNDER ANESTHESIA, RECTUM
Anesthesia: Monitor Anesthesia Care | Site: Rectum

## 2016-02-03 MED ORDER — PROMETHAZINE HCL 25 MG/ML IJ SOLN
6.2500 mg | INTRAMUSCULAR | Status: DC | PRN
  Filled 2016-02-03: qty 1

## 2016-02-03 MED ORDER — LIDOCAINE HCL (CARDIAC) 20 MG/ML IV SOLN
INTRAVENOUS | Status: DC | PRN
  Administered 2016-02-03: 50 mg via INTRAVENOUS

## 2016-02-03 MED ORDER — ONDANSETRON HCL 4 MG/2ML IJ SOLN
INTRAMUSCULAR | Status: DC | PRN
Start: 2016-02-03 — End: 2016-02-03
  Administered 2016-02-03: 4 mg via INTRAVENOUS

## 2016-02-03 MED ORDER — FENTANYL CITRATE (PF) 100 MCG/2ML IJ SOLN
INTRAMUSCULAR | Status: AC
  Filled 2016-02-03: qty 2

## 2016-02-03 MED ORDER — LACTATED RINGERS IV SOLN
INTRAVENOUS | Status: DC
  Administered 2016-02-03: 10:00:00 via INTRAVENOUS
  Filled 2016-02-03: qty 1000

## 2016-02-03 MED ORDER — PROPOFOL 10 MG/ML IV BOLUS
INTRAVENOUS | Status: AC
  Filled 2016-02-03: qty 20

## 2016-02-03 MED ORDER — LIDOCAINE 5 % EX OINT
TOPICAL_OINTMENT | CUTANEOUS | Status: DC | PRN
  Administered 2016-02-03: 1

## 2016-02-03 MED ORDER — MIDAZOLAM HCL 2 MG/2ML IJ SOLN
INTRAMUSCULAR | Status: AC
Start: 2016-02-03 — End: 2016-02-03
  Filled 2016-02-03: qty 2

## 2016-02-03 MED ORDER — OXYCODONE HCL 5 MG PO TABS: 5.0000 mg | ORAL_TABLET | Freq: Four times a day (QID) | ORAL | 0 refills | Status: DC | PRN

## 2016-02-03 MED ORDER — PROPOFOL 500 MG/50ML IV EMUL
INTRAVENOUS | Status: DC | PRN
  Administered 2016-02-03: 200 ug/kg/min via INTRAVENOUS

## 2016-02-03 MED ORDER — BUPIVACAINE-EPINEPHRINE 0.5% -1:200000 IJ SOLN
INTRAMUSCULAR | Status: DC | PRN
  Administered 2016-02-03: 20 mL
  Administered 2016-02-03: 10 mL

## 2016-02-03 MED ORDER — OXYCODONE HCL 5 MG PO TABS
5.0000 mg | ORAL_TABLET | Freq: Once | ORAL | Status: DC | PRN
  Filled 2016-02-03: qty 1

## 2016-02-03 MED ORDER — OXYCODONE HCL 5 MG/5ML PO SOLN
5.0000 mg | Freq: Once | ORAL | Status: DC | PRN
  Filled 2016-02-03: qty 5

## 2016-02-03 MED ORDER — PROPOFOL 500 MG/50ML IV EMUL
INTRAVENOUS | Status: AC
  Filled 2016-02-03: qty 50

## 2016-02-03 MED ORDER — ONDANSETRON HCL 4 MG/2ML IJ SOLN
INTRAMUSCULAR | Status: AC
  Filled 2016-02-03: qty 2

## 2016-02-03 MED ORDER — LIDOCAINE 2% (20 MG/ML) 5 ML SYRINGE
INTRAMUSCULAR | Status: AC
  Filled 2016-02-03: qty 5

## 2016-02-03 MED ORDER — FENTANYL CITRATE (PF) 100 MCG/2ML IJ SOLN
25.0000 ug | INTRAMUSCULAR | Status: DC | PRN
  Administered 2016-02-03: 25 ug via INTRAVENOUS
  Filled 2016-02-03: qty 1

## 2016-02-03 MED ORDER — MIDAZOLAM HCL 5 MG/5ML IJ SOLN
INTRAMUSCULAR | Status: DC | PRN
  Administered 2016-02-03 (×2): 1 mg via INTRAVENOUS

## 2016-02-03 MED ORDER — FENTANYL CITRATE (PF) 100 MCG/2ML IJ SOLN
INTRAMUSCULAR | Status: DC | PRN
  Administered 2016-02-03 (×2): 50 ug via INTRAVENOUS

## 2016-02-03 SURGICAL SUPPLY — 36 items
APL SKNCLS STERI-STRIP NONHPOA (GAUZE/BANDAGES/DRESSINGS) ×1
BENZOIN TINCTURE PRP APPL 2/3 (GAUZE/BANDAGES/DRESSINGS) ×3 IMPLANT
BLADE HEX COATED 2.75 (ELECTRODE) ×3 IMPLANT
BLADE SURG 15 STRL LF DISP TIS (BLADE) ×1 IMPLANT
BLADE SURG 15 STRL SS (BLADE) ×3
BRIEF STRETCH FOR OB PAD LRG (UNDERPADS AND DIAPERS) ×6 IMPLANT
CANISTER SUCTION 2500CC (MISCELLANEOUS) ×3 IMPLANT
COVER BACK TABLE 60X90IN (DRAPES) ×3 IMPLANT
COVER MAYO STAND STRL (DRAPES) ×3 IMPLANT
DRAPE LAPAROTOMY 100X72 PEDS (DRAPES) ×3 IMPLANT
DRAPE UTILITY XL STRL (DRAPES) ×3 IMPLANT
DRSG PAD ABDOMINAL 8X10 ST (GAUZE/BANDAGES/DRESSINGS) ×3 IMPLANT
ELECT REM PT RETURN 9FT ADLT (ELECTROSURGICAL) ×3
ELECTRODE REM PT RTRN 9FT ADLT (ELECTROSURGICAL) ×1 IMPLANT
GLOVE BIO SURGEON STRL SZ 6.5 (GLOVE) ×4 IMPLANT
GLOVE BIO SURGEONS STRL SZ 6.5 (GLOVE) ×2
GLOVE INDICATOR 7.0 STRL GRN (GLOVE) ×6 IMPLANT
GOWN STRL REUS W/ TWL LRG LVL3 (GOWN DISPOSABLE) ×1 IMPLANT
GOWN STRL REUS W/TWL LRG LVL3 (GOWN DISPOSABLE) ×3
KIT ROOM TURNOVER WOR (KITS) ×3 IMPLANT
NEEDLE HYPO 22GX1.5 SAFETY (NEEDLE) ×3 IMPLANT
NS IRRIG 500ML POUR BTL (IV SOLUTION) ×3 IMPLANT
PACK BASIN DAY SURGERY FS (CUSTOM PROCEDURE TRAY) ×3 IMPLANT
PAD ABD 8X10 STRL (GAUZE/BANDAGES/DRESSINGS) IMPLANT
PENCIL BUTTON HOLSTER BLD 10FT (ELECTRODE) ×3 IMPLANT
SPONGE GAUZE 4X4 12PLY STER LF (GAUZE/BANDAGES/DRESSINGS) ×3 IMPLANT
SPONGE SURGIFOAM ABS GEL 12-7 (HEMOSTASIS) IMPLANT
SUT CHROMIC 2 0 SH (SUTURE) ×3 IMPLANT
SUT VIC AB 3-0 SH 27 (SUTURE) ×3
SUT VIC AB 3-0 SH 27X BRD (SUTURE) ×1 IMPLANT
SYR CONTROL 10ML LL (SYRINGE) ×3 IMPLANT
TOWEL OR 17X24 6PK STRL BLUE (TOWEL DISPOSABLE) ×3 IMPLANT
TRAY DSU PREP LF (CUSTOM PROCEDURE TRAY) ×3 IMPLANT
TUBE CONNECTING 12'X1/4 (SUCTIONS) ×1
TUBE CONNECTING 12X1/4 (SUCTIONS) ×2 IMPLANT
YANKAUER SUCT BULB TIP NO VENT (SUCTIONS) ×3 IMPLANT

## 2016-02-03 NOTE — Anesthesia Procedure Notes (Signed)
Procedure Name: MAC Date/Time: 02/03/2016 12:06 PM Performed by: Arta BruceSSEY, KEVIN Pre-anesthesia Checklist: Patient identified, Emergency Drugs available, Suction available, Patient being monitored and Timeout performed Oxygen Delivery Method: Simple face mask Placement Confirmation: positive ETCO2,  CO2 detector and breath sounds checked- equal and bilateral

## 2016-02-03 NOTE — Anesthesia Preprocedure Evaluation (Signed)
Anesthesia Evaluation  Patient identified by MRN, date of birth, ID band Patient awake    Reviewed: Allergy & Precautions, NPO status , Patient's Chart, lab work & pertinent test results  Airway Mallampati: II  TM Distance: >3 FB Neck ROM: Full    Dental no notable dental hx.    Pulmonary Current Smoker,    Pulmonary exam normal breath sounds clear to auscultation       Cardiovascular hypertension, Normal cardiovascular exam Rhythm:Regular Rate:Normal     Neuro/Psych negative neurological ROS  negative psych ROS   GI/Hepatic negative GI ROS, Neg liver ROS,   Endo/Other  negative endocrine ROS  Renal/GU negative Renal ROS  negative genitourinary   Musculoskeletal negative musculoskeletal ROS (+)   Abdominal   Peds negative pediatric ROS (+)  Hematology  (+) HIV,   Anesthesia Other Findings   Reproductive/Obstetrics negative OB ROS                             Anesthesia Physical Anesthesia Plan  ASA: III  Anesthesia Plan: MAC   Post-op Pain Management:    Induction: Intravenous  Airway Management Planned: Simple Face Mask  Additional Equipment:   Intra-op Plan:   Post-operative Plan:   Informed Consent: I have reviewed the patients History and Physical, chart, labs and discussed the procedure including the risks, benefits and alternatives for the proposed anesthesia with the patient or authorized representative who has indicated his/her understanding and acceptance.   Dental advisory given  Plan Discussed with: CRNA and Surgeon  Anesthesia Plan Comments:         Anesthesia Quick Evaluation

## 2016-02-03 NOTE — Op Note (Signed)
02/03/2016  12:36 PM  PATIENT:  Roma SchanzAdrian Krass  25 y.o. male  Patient Care Team: No Pcp Per Patient as PCP - General (General Practice) Randall Hissornelius N Van Dam, MD as PCP - Infectious Diseases (Infectious Diseases)  PRE-OPERATIVE DIAGNOSIS:  ANAL FISTULA  POST-OPERATIVE DIAGNOSIS:  ANAL FISTULA  PROCEDURE: ANAL EXAM UNDER ANESTHESIA ANAL FISTULOTOMY  Surgeon(s): Romie LeveeAlicia Caira Poche, MD  ASSISTANT: none   ANESTHESIA:   local and MAC  SPECIMEN:  No Specimen  DISPOSITION OF SPECIMEN:  N/A  COUNTS:  YES  PLAN OF CARE: Discharge to home after PACU  PATIENT DISPOSITION:  PACU - hemodynamically stable.  INDICATION: 25 year old male who presents to the office with a chronically draining perianal fistula. I recommended a fistulotomy versus seton placement. We discussed the risk of procedure including bleeding and infection.  We discussed there is a small risk of incontinence with fistulotomy and a small risk of recurrence with seton placement and lift procedure.   OR FINDINGS: Transsphincteric fistula involving minimal internal sphincter and superficial external sphincter.  DESCRIPTION: the patient was identified in the preoperative holding area and taken to the OR where they were laid on the operating room table.  MAC anesthesia was induced without difficulty. The patient was then positioned in prone jackknife position with buttocks gently taped apart.  The patient was then prepped and draped in usual sterile fashion.  SCDs were noted to be in place prior to the initiation of anesthesia. A surgical timeout was performed indicating the correct patient, procedure, positioning and need for preoperative antibiotics.  A rectal block was performed using Marcaine with epinephrine.    I began with a digital rectal exam.  There was an inflammatory nodule palpated in the left posterior lateral region concerning for possible fistula site internally.  I then placed a Hill-Ferguson anoscope into the anal  canal and evaluated this completely.  This I could be evaluated and appeared to be an inflammatory reaction to the internal opening.  I identified the external opening. There is quite a bit of scar tissue around this and I was unable to place the fistula probe through the area. I removed the scar tissue with Metzenbaum scissors. I was unable to get the fistula probe into the fistula tract and down into the internal opening. I evaluated the amount of muscle involved. There was minimal internal sphincter involvement. The fistula exited internally well above the dentate line. There was superficial external sphincter involvement. I decided to perform a fistulotomy. This was done using light cautery. The superficial external sphincter was then tacked to the fistula tract using 3-0 Vicryl sutures. The edges of the fistulotomy site were then marsupialized to the fistulotomy tract using a running 2-0 chromic suture. Hemostasis was good at the end of the procedure. Patient tolerated this well was awakened from anesthesia without difficulty. He was sent to the postanesthesia care unit in stable condition. All counts were correct per operating room staff.

## 2016-02-03 NOTE — H&P (Signed)
The patient is a 25 year old male who presents with a complaint of Fistula. 25 year old male who presents to the office for evaluation of perianal drainage. He states for the last 2 years he has had intermittent bloody and purulent perianal drainage. He has minimal pain from the area. This all started when he developed an abscess. He denies any chronic diarrhea.   Other Problems Doristine Devoid(Chemira Jones, CMA; 12/27/2015 9:28 AM) No pertinent past medical history  Past Surgical History Doristine Devoid(Chemira Jones, CMA; 12/27/2015 9:28 AM) No pertinent past surgical history  Diagnostic Studies History Doristine Devoid(Chemira Jones, CMA; 12/27/2015 9:28 AM) Colonoscopy within last year  Allergies Timmothy Euler(Sade Bradford, CMA; 12/27/2015 9:13 AM) No Known Drug Allergies 12/27/2015  Medication History Timmothy Euler(Sade Bradford, CMA; 12/27/2015 9:14 AM) Nils Pyleriumeq (600-50-300MG  Tablet, Oral daily) Active. Medications Reconciled  Social History Doristine Devoid(Chemira Jones, CMA; 12/27/2015 9:28 AM) Alcohol use Occasional alcohol use. Caffeine use Coffee, Tea. Illicit drug use Uses socially only. Tobacco use Current some day smoker.  Family History Doristine Devoid(Chemira Jones, CMA; 12/27/2015 9:28 AM) Hypertension Mother.     Review of Systems General Not Present- Appetite Loss, Chills, Fatigue, Fever, Night Sweats, Weight Gain and Weight Loss. Skin Present- Non-Healing Wounds. Not Present- Change in Wart/Mole, Dryness, Hives, Jaundice, New Lesions, Rash and Ulcer. HEENT Not Present- Earache, Hearing Loss, Hoarseness, Nose Bleed, Oral Ulcers, Ringing in the Ears, Seasonal Allergies, Sinus Pain, Sore Throat, Visual Disturbances, Wears glasses/contact lenses and Yellow Eyes. Respiratory Not Present- Bloody sputum, Chronic Cough, Difficulty Breathing, Snoring and Wheezing. Breast Not Present- Breast Mass, Breast Pain, Nipple Discharge and Skin Changes. Cardiovascular Not Present- Chest Pain, Difficulty Breathing Lying Down, Leg Cramps, Palpitations,  Rapid Heart Rate, Shortness of Breath and Swelling of Extremities. Gastrointestinal Present- Abdominal Pain. Not Present- Bloating, Bloody Stool, Change in Bowel Habits, Chronic diarrhea, Constipation, Difficulty Swallowing, Excessive gas, Gets full quickly at meals, Hemorrhoids, Indigestion, Nausea, Rectal Pain and Vomiting. Male Genitourinary Not Present- Blood in Urine, Change in Urinary Stream, Frequency, Impotence, Nocturia, Painful Urination, Urgency and Urine Leakage. Musculoskeletal Not Present- Back Pain, Joint Pain, Joint Stiffness, Muscle Pain, Muscle Weakness and Swelling of Extremities. Neurological Not Present- Decreased Memory, Fainting, Headaches, Numbness, Seizures, Tingling, Tremor, Trouble walking and Weakness. Psychiatric Not Present- Anxiety, Bipolar, Change in Sleep Pattern, Depression, Fearful and Frequent crying. Endocrine Not Present- Cold Intolerance, Excessive Hunger, Hair Changes, Heat Intolerance, Hot flashes and New Diabetes. Hematology Not Present- Blood Thinners, Easy Bruising, Excessive bleeding, Gland problems, HIV and Persistent Infections.  BP 140/83   Pulse 70   Temp 97.8 F (36.6 C) (Oral)   Resp 16   Ht 5\' 6"  (1.676 m)   Wt 81.2 kg (179 lb)   SpO2 100%   BMI 28.89 kg/m     Physical Exam General Mental Status-Alert. General Appearance-Not in acute distress. Build & Nutrition-Well nourished. Posture-Normal posture. Gait-Normal.  Head and Neck Head-normocephalic, atraumatic with no lesions or palpable masses. Trachea-midline.  Chest and Lung Exam Chest and lung exam reveals -on auscultation, normal breath sounds, no adventitious sounds and normal vocal resonance.  Cardiovascular Cardiovascular examination reveals -normal heart sounds, regular rate and rhythm with no murmurs and no digital clubbing, cyanosis, edema, increased warmth or tenderness.  Abdomen Inspection Inspection of the abdomen reveals - No  Hernias. Palpation/Percussion Palpation and Percussion of the abdomen reveal - Soft, Non Tender, No Rigidity (guarding), No hepatosplenomegaly and No Palpable abdominal masses.  Rectal Anorectal Exam External - Note: Pinhole defect in left posterior lateral perianal region which expresses purulence.  Neurologic Neurologic  evaluation reveals -alert and oriented x 3 with no impairment of recent or remote memory, normal attention span and ability to concentrate, normal sensation and normal coordination.  Musculoskeletal Normal Exam - Bilateral-Upper Extremity Strength Normal and Lower Extremity Strength Normal.    Assessment & Plan   PERIANAL FISTULA (K60.3) Impression: 25 year old male who presents to the office with a perianal fistula. On exam this is in the left posterior lateral perianal space. The scan be expressed. I recommended an exam under anesthesia with possible fistulotomy versus seton placement. We have discussed each option in detail. Risk of procedure include bleeding. There is a small chance of incontinence if the fistulotomy is performed.

## 2016-02-03 NOTE — Discharge Instructions (Addendum)
Post Anesthesia Home Care Instructions  Activity: Get plenty of rest for the remainder of the day. A responsible adult should stay with you for 24 hours following the procedure.  For the next 24 hours, DO NOT: -Drive a car -Operate machinery -Drink alcoholic beverages -Take any medication unless instructed by your physician -Make any legal decisions or sign important papers.  Meals: Start with liquid foods such as gelatin or soup. Progress to regular foods as tolerated. Avoid greasy, spicy, heavy foods. If nausea and/or vomiting occur, drink only clear liquids until the nausea and/or vomiting subsides. Call your physician if vomiting continues.  Special Instructions/Symptoms: Your throat may feel dry or sore from the anesthesia or the breathing tube placed in your throat during surgery. If this causes discomfort, gargle with warm salt water. The discomfort should disappear within 24 hours.  If you had a scopolamine patch placed behind your ear for the management of post- operative nausea and/or vomiting:  1. The medication in the patch is effective for 72 hours, after which it should be removed.  Wrap patch in a tissue and discard in the trash. Wash hands thoroughly with soap and water. 2. You may remove the patch earlier than 72 hours if you experience unpleasant side effects which may include dry mouth, dizziness or visual disturbances. 3. Avoid touching the patch. Wash your hands with soap and water after contact with the patch.   ANORECTAL SURGERY: POST OP INSTRUCTIONS 1. Take your usually prescribed home medications unless otherwise directed. 2. DIET: During the first few hours after surgery sip on some liquids until you are able to urinate.  It is normal to not urinate for several hours after this surgery.  If you feel uncomfortable, please contact the office for instructions.  After you are able to urinate,you may eat, if you feel like it.  Follow a light bland diet the first 24  hours after arrival home, such as soup, liquids, crackers, etc.  Be sure to include lots of fluids daily (6-8 glasses).  Avoid fast food or heavy meals, as your are more likely to get nauseated.  Eat a low fat diet the next few days after surgery.  Limit caffeine intake to 1-2 servings a day. 3. PAIN CONTROL: a. Pain is best controlled by a usual combination of several different methods TOGETHER: i. Muscle relaxation: Soak in a warm bath (or Sitz bath) three times a day and after bowel movements.  Continue to do this until all pain is resolved. ii. Over the counter pain medication iii. Prescription pain medication b. Most patients will experience some swelling and discomfort in the anus/rectal area and incisions.  Heat such as warm towels, sitz baths, warm baths, etc to help relax tight/sore spots and speed recovery.  Some people prefer to use ice, especially in the first couple days after surgery, as it may decrease the pain and swelling, or alternate between ice & heat.  Experiment to what works for you.  Swelling and bruising can take several weeks to resolve.  Pain can take even longer to completely resolve. c. It is helpful to take an over-the-counter pain medication regularly for the first few weeks.  Choose one of the following that works best for you: i. Naproxen (Aleve, etc)  Two 220mg tabs twice a day ii. Ibuprofen (Advil, etc) Three 200mg tabs four times a day (every meal & bedtime) d. A  prescription for pain medication (such as percocet, oxycodone, hydrocodone, etc) should be given to you upon   discharge.  Take your pain medication as prescribed.  i. If you are having problems/concerns with the prescription medicine (does not control pain, nausea, vomiting, rash, itching, etc), please call us [phone] to see if we need to switch you to a different pain medicine that will work better for you and/or control your side effect better. ii. If you need a refill on your pain medication, please  contact your pharmacy.  They will contact our office to request authorization. Prescriptions will not be filled after 5 pm or on week-ends. 4. KEEP YOUR BOWELS REGULAR and AVOID CONSTIPATION a. The goal is one to two soft bowel movements a day.  You should at least have a bowel movement every other day. b. Avoid getting constipated.  Between the surgery and the pain medications, it is common to experience some constipation. This can be very painful after rectal surgery.  Increasing fluid intake and taking a fiber supplement (such as Metamucil, Citrucel, FiberCon, etc) 1-2 times a day regularly will usually help prevent this problem from occurring.  A stool softener like colace is also recommended.  This can be purchased over the counter at your pharmacy.  You can take it up to 3 times a day.  If you do not have a bowel movement after 24 hrs since your surgery, take one does of milk of magnesia.  If you still haven't had a bowel movement 8-12 hours after that dose, take another dose.  If you don't have a bowel movement 48 hrs after surgery, purchase a Fleets enema from the drug store and administer gently per package instructions.  If you still are having trouble with your bowel movements after that, please call the office for further instructions. c. If you develop diarrhea or have many loose bowel movements, simplify your diet to bland foods & liquids for a few days.  Stop any stool softeners and decrease your fiber supplement.  Switching to mild anti-diarrheal medications (Kayopectate, Pepto Bismol) can help.  If this worsens or does not improve, please call us.  5. Wound Care a. Remove your bandages before your first bowel movement or 8 hours after surgery.     b. Remove any wound packing material at this tim,e as well.  You do not need to repack the wound unless instructed otherwise.  Wear an absorbent pad or soft cotton gauze in your underwear to catch any drainage and help keep the area clean. You  should change this every 2-3 hours while awake. c. Keep the area clean and dry.  Bathe / shower every day, especially after bowel movements.  Keep the area clean by showering / bathing over the incision / wound.   It is okay to soak an open wound to help wash it.  Wet wipes or showers / gentle washing after bowel movements is often less traumatic than regular toilet paper. d. You may have some styrofoam-like soft packing in the rectum which will come out with the first bowel movement.  e. You will often notice bleeding with bowel movements.  This should slow down by the end of the first week of surgery f. Expect some drainage.  This should slow down, too, by the end of the first week of surgery.  Wear an absorbent pad or soft cotton gauze in your underwear until the drainage stops. g. Do Not sit on a rubber or pillow ring.  This can make you symptoms worse.  You may sit on a soft pillow if needed.  6.   ACTIVITIES as tolerated:   a. You may resume regular (light) daily activities beginning the next day--such as daily self-care, walking, climbing stairs--gradually increasing activities as tolerated.  If you can walk 30 minutes without difficulty, it is safe to try more intense activity such as jogging, treadmill, bicycling, low-impact aerobics, swimming, etc. b. Save the most intensive and strenuous activity for last such as sit-ups, heavy lifting, contact sports, etc  Refrain from any heavy lifting or straining until you are off narcotics for pain control.   c. You may drive when you are no longer taking prescription pain medication, you can comfortably sit for long periods of time, and you can safely maneuver your car and apply brakes. d. You may have sexual intercourse when it is comfortable.  7. FOLLOW UP in our office a. Please call [facility] at [phone] to set up an appointment to see your surgeon in the office for a follow-up appointment approximately 3-4 weeks after your surgery. b. Make sure that  you call for this appointment the day you arrive home to insure a convenient appointment time. 10. IF YOU HAVE DISABILITY OR FAMILY LEAVE FORMS, BRING THEM TO THE OFFICE FOR PROCESSING.  DO NOT GIVE THEM TO YOUR DOCTOR.     WHEN TO CALL US [phone]: 1. Poor pain control 2. Reactions / problems with new medications (rash/itching, nausea, etc)  3. Fever over 101.5 F (38.5 C) 4. Inability to urinate 5. Nausea and/or vomiting 6. Worsening swelling or bruising 7. Continued bleeding from incision. 8. Increased pain, redness, or drainage from the incision  The clinic staff is available to answer your questions during regular business hours (8:30am-5pm).  Please don't hesitate to call and ask to speak to one of our nurses for clinical concerns.   A surgeon from [facility] is always on call at the hospitals   If you have a medical emergency, go to the nearest emergency room or call 911.    [facility] [address], [address], [place], [place]  [address] ? MAIN: [phone] ? TOLL FREE: [phone] ? FAX [phone] [email or web address]    

## 2016-02-03 NOTE — Transfer of Care (Signed)
Last Vitals:  Vitals:   02/03/16 0959  BP: 140/83  Pulse: 70  Resp: 16  Temp: 36.6 C    Last Pain:  Vitals:   02/03/16 0959  TempSrc: Oral      Patients Stated Pain Goal: 7 (02/03/16 1009) Immediate Anesthesia Transfer of Care Note  Patient: Patrick SchanzAdrian Przybylski  Procedure(s) Performed: Procedure(s) (LRB): ANAL EXAM UNDER ANESTHESIA (N/A) ANAL FISTULOTOMY (N/A)  Patient Location: PACU  Anesthesia Type: MAC  Level of Consciousness: awake, alert  and oriented  Airway & Oxygen Therapy: Patient Spontanous Breathing and Patient connected to face mask oxygen  Post-op Assessment: Report given to PACU RN and Post -op Vital signs reviewed and stable  Post vital signs: Reviewed and stable  Complications: No apparent anesthesia complications

## 2016-02-03 NOTE — Anesthesia Postprocedure Evaluation (Signed)
Anesthesia Post Note  Patient: Patrick Reilly  Procedure(s) Performed: Procedure(s) (LRB): ANAL EXAM UNDER ANESTHESIA (N/A) ANAL FISTULOTOMY (N/A)  Patient location during evaluation: PACU Anesthesia Type: MAC Level of consciousness: awake and alert Pain management: pain level controlled Vital Signs Assessment: post-procedure vital signs reviewed and stable Respiratory status: spontaneous breathing, nonlabored ventilation, respiratory function stable and patient connected to nasal cannula oxygen Cardiovascular status: stable and blood pressure returned to baseline Anesthetic complications: no       Last Vitals:  Vitals:   02/03/16 0959 02/03/16 1247  BP: 140/83 134/77  Pulse: 70 82  Resp: 16 16  Temp: 36.6 C 36.6 C    Last Pain:  Vitals:   02/03/16 1247  TempSrc:   PainSc: 0-No pain                 Bronda Alfred DAVID

## 2016-02-07 ENCOUNTER — Encounter (HOSPITAL_BASED_OUTPATIENT_CLINIC_OR_DEPARTMENT_OTHER): Payer: Self-pay | Admitting: General Surgery

## 2016-08-24 ENCOUNTER — Telehealth: Payer: Self-pay | Admitting: *Deleted

## 2016-08-24 NOTE — Telephone Encounter (Signed)
Pharmacy called, stating they were reporting a potential drug reaction. Patient has developed a rash, now on his abdomen.  Patient last seen 10/2015 by Dr Daiva EvesVan Dam with a follow up 10/17 with Pharmacy, where he was changed to Triumeq.  Patient did not return. Per pharmacy, patient's fill history is 10/30, 12/1, 1/2, 4/24, 5/23, 6/20.   RN called patient, scheduled him with pharmacy on 7/25 at 9am. Andree CossHowell, Caily Rakers M, RN

## 2016-08-26 NOTE — Telephone Encounter (Signed)
thanks

## 2016-08-29 ENCOUNTER — Ambulatory Visit (INDEPENDENT_AMBULATORY_CARE_PROVIDER_SITE_OTHER): Payer: BLUE CROSS/BLUE SHIELD | Admitting: Pharmacist

## 2016-08-29 DIAGNOSIS — B20 Human immunodeficiency virus [HIV] disease: Secondary | ICD-10-CM | POA: Diagnosis not present

## 2016-08-29 DIAGNOSIS — L2082 Flexural eczema: Secondary | ICD-10-CM | POA: Diagnosis not present

## 2016-08-29 LAB — LIPID PANEL
CHOL/HDL RATIO: 2.7 ratio (ref <5.0)
CHOLESTEROL: 119 mg/dL (ref <200)
HDL: 44 mg/dL (ref >40)
LDL Cholesterol: 61 mg/dL (ref <100)
Triglycerides: 68 mg/dL (ref <150)
VLDL: 14 mg/dL (ref <30)

## 2016-08-29 LAB — COMPREHENSIVE METABOLIC PANEL
ALK PHOS: 59 U/L (ref 40–115)
ALT: 19 U/L (ref 9–46)
AST: 17 U/L (ref 10–40)
Albumin: 4.3 g/dL (ref 3.6–5.1)
BUN: 14 mg/dL (ref 7–25)
CALCIUM: 8.6 mg/dL (ref 8.6–10.3)
CO2: 22 mmol/L (ref 20–31)
Chloride: 107 mmol/L (ref 98–110)
Creat: 1.09 mg/dL (ref 0.60–1.35)
Glucose, Bld: 91 mg/dL (ref 65–99)
POTASSIUM: 3.7 mmol/L (ref 3.5–5.3)
Sodium: 138 mmol/L (ref 135–146)
TOTAL PROTEIN: 7.3 g/dL (ref 6.1–8.1)
Total Bilirubin: 0.3 mg/dL (ref 0.2–1.2)

## 2016-08-29 LAB — CBC
HCT: 41.6 % (ref 38.5–50.0)
HEMOGLOBIN: 13.8 g/dL (ref 13.2–17.1)
MCH: 28.1 pg (ref 27.0–33.0)
MCHC: 33.2 g/dL (ref 32.0–36.0)
MCV: 84.7 fL (ref 80.0–100.0)
MPV: 9.5 fL (ref 7.5–12.5)
Platelets: 174 10*3/uL (ref 140–400)
RBC: 4.91 MIL/uL (ref 4.20–5.80)
RDW: 14.8 % (ref 11.0–15.0)
WBC: 4.5 10*3/uL (ref 3.8–10.8)

## 2016-08-29 MED ORDER — BETAMETHASONE VALERATE 0.1 % EX OINT: 1.0000 "application " | TOPICAL_OINTMENT | Freq: Two times a day (BID) | CUTANEOUS | 1 refills | Status: DC

## 2016-08-29 MED ORDER — ABACAVIR-DOLUTEGRAVIR-LAMIVUD 600-50-300 MG PO TABS: 1.0000 | ORAL_TABLET | Freq: Every day | ORAL | 6 refills | Status: DC

## 2016-08-29 MED ORDER — BETAMETHASONE VALERATE 0.1 % EX OINT
1.0000 | TOPICAL_OINTMENT | Freq: Two times a day (BID) | CUTANEOUS | 1 refills | Status: DC
Start: 2016-08-29 — End: 2016-08-30

## 2016-08-29 NOTE — Progress Notes (Signed)
HPI: Patrick Reilly is a 26 y.o. male who presents to the [facility] pharmacy clinic for HIV follow-up.  He has not been seen since October 2017.  Allergies: No Known Allergies  Past Medical History: Past Medical History:  Diagnosis Date  . Headache   . HIV infection (HCC)   . HTN (hypertension) 11/01/2015  . Hypertension   . Rash, skin    abdomen; treated w cream  . Wears glasses     Social History: Social History   Social History  . Marital status: Single    Spouse name: N/A  . Number of children: N/A  . Years of education: N/A   Social History Main Topics  . Smoking status: Current Some Day Smoker    Packs/day: 0.10    Types: Cigarettes  . Smokeless tobacco: Never Used     Comment: only smokes when drinking  . Alcohol use 4.2 oz/week    7 Standard drinks or equivalent per week     Comment: wine, shots, beer on weekends  . Drug use: No     Comment: stopped 3 weeks  . Sexual activity: Yes    Partners: Male     Comment: accepted condoms   Other Topics Concern  . Not on file   Social History Narrative  . No narrative on file    Current Regimen: Triumeq  Labs: HIV 1 RNA Quant (copies/mL)  Date Value  11/01/2015 2,949 (H)  02/23/2014 <20  11/09/2013 <20   CD4 T Cell Abs (/uL)  Date Value  11/01/2015 520  02/23/2014 690  11/09/2013 790   Hep B S Ab (no units)  Date Value  12/21/2008 POS (A)   Hepatitis B Surface Ag (no units)  Date Value  12/21/2008 NEG   HCV Ab (no units)  Date Value  12/21/2008 NEG    CrCl: CrCl cannot be calculated (Patient's most recent lab result is older than the maximum 21 days allowed.).  Lipids:    Component Value Date/Time   CHOL 96 11/09/2013 1649   TRIG 85 11/09/2013 1649   HDL 40 11/09/2013 1649   CHOLHDL 2.4 11/09/2013 1649   VLDL 17 11/09/2013 1649   LDLCALC 39 11/09/2013 1649    Assessment: Patrick Reilly is here today to follow-up for his HIV infection.  He mainly comes in today because he has developed a rash on  his abdomen.  He was last seen by me in October 2017.  He no showed his last appointment with Dr. Daiva EvesVan Dam on 10/31.  He tells me his schedule is crazy busy.  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 He states his schedule changes constantly.  Regarding his rash, he states it started out as a small patch of dry, itchy skin that eventually evolved into a larger area taking up more space on his abdomen.  It started ~3 months ago and he states he has had no new medications, soaps, laundry detergent, etc. I brought Judeth CornfieldStephanie, our NP in to see him just to take a look. She thinks it is classic eczema and is going to send in a steroid cream for him to put on the area twice daily.  In regards to his HIV medication, we have the refill history from [facility].  He filled Triumeq 10/31, 12/1, 1/2, 4/24, 5/23, and 6/20.  I asked him what happened in February and March and he states he had left over [place] and Descovy that he used.  He states he misses 1-2  doses/month. He usually takes it at 6-7pm at night and misses some doses when he drinks at night. I spent some time going over the importance of taking the medication every day. He states he knows the importance. I told him we needed to get him back in to see Dr. Daiva EvesVan Dam and offered to make the appointment, but he declined saying he has to call me with his schedule. He said he would call me tomorrow.  He also states he has not been sexually active lately because he is too busy.  Will get labs today while he is here.   Plans: - Continue Triumeq PO once daily - Betamethasone 0.1% cream BID - HIV RNA w reflex, CD4, RPR, CMET, CBC, lipid panel - Set up appt with Dr. Daiva EvesVan Dam when he calls me back  Cassie L. Kuppelweiser, PharmD, CPP Infectious Diseases Clinical Pharmacist [facility] 08/29/2016, 9:28 AM

## 2016-08-29 NOTE — Patient Instructions (Signed)
BETAMETHASONE cream will be available for pick up at pharmacy with a refill. Apply to your skin TWICE a day for up to 2 weeks.  Do not use this cream longer than 2 weeks - if it does not work with good consistent use let us know.     Eczema Eczema, also called atopic dermatitis, is a skin disorder that causes inflammation of the skin. It causes a red rash and dry, scaly skin. The skin becomes very itchy. Eczema is generally worse during the cooler winter months and often improves with the warmth of summer. Eczema usually starts showing signs in infancy. Some children outgrow eczema, but it may last through adulthood. What are the causes? The exact cause of eczema is not known, but it appears to run in families. People with eczema often have a family history of eczema, allergies, asthma, or hay fever. Eczema is not contagious. Flare-ups of the condition may be caused by:  Contact with something you are sensitive or allergic to.  Stress.  What are the signs or symptoms?  Dry, scaly skin.  Red, itchy rash.  Itchiness. This may occur before the skin rash and may be very intense. How is this diagnosed? The diagnosis of eczema is usually made based on symptoms and medical history. How is this treated? Eczema cannot be cured, but symptoms usually can be controlled with treatment and other strategies. A treatment plan might include:  Controlling the itching and scratching. ? Use over-the-counter antihistamines as directed for itching. This is especially useful at night when the itching tends to be worse. ? Use over-the-counter steroid creams as directed for itching. ? Avoid scratching. Scratching makes the rash and itching worse. It may also result in a skin infection (impetigo) due to a break in the skin caused by scratching.  Keeping the skin well moisturized with creams every day. This will seal in moisture and help prevent dryness. Lotions that contain alcohol and water should be avoided  because they can dry the skin.  Limiting exposure to things that you are sensitive or allergic to (allergens).  Recognizing situations that cause stress.  Developing a plan to manage stress.  Follow these instructions at home:  Only take over-the-counter or prescription medicines as directed by your health care provider.  Do not use anything on the skin without checking with your health care provider.  Keep baths or showers short (5 minutes) in warm (not hot) water. Use mild cleansers for bathing. These should be unscented. You may add nonperfumed bath oil to the bath water. It is best to avoid soap and bubble bath.  Immediately after a bath or shower, when the skin is still damp, apply a moisturizing ointment to the entire body. This ointment should be a petroleum ointment. This will seal in moisture and help prevent dryness. The thicker the ointment, the better. These should be unscented.  Keep fingernails cut short. Children with eczema may need to wear soft gloves or mittens at night after applying an ointment.  Dress in clothes made of cotton or cotton blends. Dress lightly, because heat increases itching.  A child with eczema should stay away from anyone with fever blisters or cold sores. The virus that causes fever blisters (herpes simplex) can cause a serious skin infection in children with eczema. Contact a health care provider if:  Your itching interferes with sleep.  Your rash gets worse or is not better within 1 week after starting treatment.  You see pus or soft yellow scabs  in the rash area.  You have a fever.  You have a rash flare-up after contact with someone who has fever blisters. This information is not intended to replace advice given to you by your health care provider. Make sure you discuss any questions you have with your health care provider.

## 2016-08-30 ENCOUNTER — Other Ambulatory Visit: Payer: Self-pay | Admitting: *Deleted

## 2016-08-30 DIAGNOSIS — L2082 Flexural eczema: Secondary | ICD-10-CM

## 2016-08-30 LAB — T-HELPER CELL (CD4) - (RCID CLINIC ONLY)
CD4 T CELL ABS: 730 /uL (ref 400–2700)
CD4 T CELL HELPER: 25 % — AB (ref 33–55)

## 2016-08-30 LAB — RPR

## 2016-08-30 MED ORDER — BETAMETHASONE VALERATE 0.1 % EX OINT: 1.0000 "application " | TOPICAL_OINTMENT | Freq: Two times a day (BID) | CUTANEOUS | 1 refills | Status: DC

## 2016-08-30 NOTE — Progress Notes (Signed)
[facility] pharmacy faxed request for original prescription to be e-scribed. Andree CossHowell, Michelle M, RN

## 2016-09-01 LAB — HIV-1 RNA,QN PCR W/REFLEX GENOTYPE: HIV-1 RNA, QN PCR: <20 Copies/mL

## 2016-09-03 ENCOUNTER — Other Ambulatory Visit: Payer: Self-pay | Admitting: *Deleted

## 2016-09-03 DIAGNOSIS — L2082 Flexural eczema: Secondary | ICD-10-CM

## 2016-09-03 MED ORDER — BETAMETHASONE VALERATE 0.1 % EX OINT: 1.0000 "application " | TOPICAL_OINTMENT | Freq: Two times a day (BID) | CUTANEOUS | 1 refills | Status: DC

## 2016-09-04 NOTE — Telephone Encounter (Signed)
I assume he has bbeen seen by now?

## 2016-09-05 NOTE — Telephone Encounter (Signed)
He saw Cassie on 7/25, had labs that day as well.

## 2016-09-05 NOTE — Telephone Encounter (Signed)
Perfect

## 2016-09-30 DIAGNOSIS — Z23 Encounter for immunization: Secondary | ICD-10-CM

## 2017-03-14 NOTE — Addendum Note (Signed)
Addended by: POOLE, TRAVIS F on: 03/14/2017 03:20 PM   Modules accepted: Orders  

## 2017-03-14 NOTE — Addendum Note (Signed)
Addended by: Lurlean LeydenPOOLE, Cindy Fullman F on: 03/14/2017 03:20 PM   Modules accepted: Orders

## 2017-03-14 NOTE — Addendum Note (Signed)
Addended by: Teagen Mcleary F on: 03/14/2017 03:20 PM   Modules accepted: Orders  

## 2017-03-18 ENCOUNTER — Other Ambulatory Visit: Payer: Self-pay

## 2017-03-18 ENCOUNTER — Ambulatory Visit: Payer: Self-pay

## 2017-03-18 ENCOUNTER — Other Ambulatory Visit: Payer: Self-pay | Admitting: Infectious Diseases

## 2017-03-18 DIAGNOSIS — B20 Human immunodeficiency virus [HIV] disease: Secondary | ICD-10-CM

## 2017-03-18 LAB — COMPLETE METABOLIC PANEL WITH GFR
AG RATIO: 1.6 (calc) (ref 1.0–2.5)
ALBUMIN MSPROF: 4.3 g/dL (ref 3.6–5.1)
ALT: 11 U/L (ref 9–46)
AST: 15 U/L (ref 10–40)
Alkaline phosphatase (APISO): 62 U/L (ref 40–115)
BILIRUBIN TOTAL: 0.3 mg/dL (ref 0.2–1.2)
BUN: 12 mg/dL (ref 7–25)
CHLORIDE: 107 mmol/L (ref 98–110)
CO2: 26 mmol/L (ref 20–32)
Calcium: 9 mg/dL (ref 8.6–10.3)
Creat: 1.12 mg/dL (ref 0.60–1.35)
GFR, EST AFRICAN AMERICAN: 103 mL/min/{1.73_m2} (ref >=60)
GFR, Est Non African American: 89 mL/min/{1.73_m2} (ref >=60)
GLOBULIN: 2.7 g/dL (ref 1.9–3.7)
Glucose, Bld: 90 mg/dL (ref 65–99)
POTASSIUM: 3.7 mmol/L (ref 3.5–5.3)
SODIUM: 140 mmol/L (ref 135–146)
TOTAL PROTEIN: 7 g/dL (ref 6.1–8.1)

## 2017-03-18 LAB — CBC WITH DIFFERENTIAL/PLATELET
BASOS ABS: 18 {cells}/uL (ref 0–200)
BASOS PCT: 0.4 %
EOS ABS: 138 {cells}/uL (ref 15–500)
Eosinophils Relative: 3 %
HEMATOCRIT: 41.1 % (ref 38.5–50.0)
HEMOGLOBIN: 14 g/dL (ref 13.2–17.1)
Lymphs Abs: 2424 cells/uL (ref 850–3900)
MCH: 27.9 pg (ref 27.0–33.0)
MCHC: 34.1 g/dL (ref 32.0–36.0)
MCV: 81.9 fL (ref 80.0–100.0)
MPV: 10.1 fL (ref 7.5–12.5)
Monocytes Relative: 10.4 %
NEUTROS ABS: 1541 {cells}/uL (ref 1500–7800)
Neutrophils Relative %: 33.5 %
Platelets: 166 10*3/uL (ref 140–400)
RBC: 5.02 10*6/uL (ref 4.20–5.80)
RDW: 13.8 % (ref 11.0–15.0)
Total Lymphocyte: 52.7 %
WBC: 4.6 10*3/uL (ref 3.8–10.8)
WBCMIX: 478 {cells}/uL (ref 200–950)

## 2017-03-19 LAB — T-HELPER CELL (CD4) - (RCID CLINIC ONLY)
CD4 % Helper T Cell: 22 % — ABNORMAL LOW (ref 33–55)
CD4 T CELL ABS: 530 /uL (ref 400–2700)

## 2017-03-25 LAB — HLA B*5701: HLA-B 5701 W/RFLX HLA-B HIGH: NEGATIVE

## 2017-03-27 LAB — HIV RNA, RTPCR W/R GT (RTI, PI,INT)
HIV 1 RNA QUANT: DETECTED {copies}/mL
HIV-1 RNA QUANT, LOG: DETECTED {Log_copies}/mL

## 2017-04-01 ENCOUNTER — Encounter: Payer: Self-pay | Admitting: Infectious Disease

## 2017-04-01 ENCOUNTER — Ambulatory Visit (INDEPENDENT_AMBULATORY_CARE_PROVIDER_SITE_OTHER): Payer: BLUE CROSS/BLUE SHIELD | Admitting: Infectious Disease

## 2017-04-01 VITALS — BP 142/98 | HR 69 | Temp 98.8°F | Ht 67.0 in | Wt 173.0 lb

## 2017-04-01 DIAGNOSIS — B20 Human immunodeficiency virus [HIV] disease: Secondary | ICD-10-CM

## 2017-04-01 DIAGNOSIS — Z23 Encounter for immunization: Secondary | ICD-10-CM

## 2017-04-01 DIAGNOSIS — Z113 Encounter for screening for infections with a predominantly sexual mode of transmission: Secondary | ICD-10-CM

## 2017-04-01 DIAGNOSIS — L2082 Flexural eczema: Secondary | ICD-10-CM

## 2017-04-01 DIAGNOSIS — R21 Rash and other nonspecific skin eruption: Secondary | ICD-10-CM

## 2017-04-01 HISTORY — DX: Rash and other nonspecific skin eruption: R21

## 2017-04-01 HISTORY — DX: Flexural eczema: L20.82

## 2017-04-01 MED ORDER — BICTEGRAVIR-EMTRICITAB-TENOFOV 50-200-25 MG PO TABS: 1.0000 | ORAL_TABLET | Freq: Every day | ORAL | 11 refills | Status: DC

## 2017-04-01 MED ORDER — BETAMETHASONE VALERATE 0.1 % EX OINT: 1.0000 "application " | TOPICAL_OINTMENT | Freq: Two times a day (BID) | CUTANEOUS | 1 refills | Status: DC

## 2017-04-01 NOTE — Progress Notes (Signed)
Subjective:   Chief complaint:rash on abdomen, elbows and legs   Patient ID: Patrick Reilly, male    DOB: January 03, 1991, 27 y.o.   MRN: [id number]  HPI  27 year old [place] man who is perfectly suppressed on his antiretroviral regimen of Atripla but who has not been seen by me for nearly 1.5 years. He then claimed when I saw him a year ago that he was taking his ARV QOD. I checked VL which did not show VF with resistance fortunately.  I thought we had changed him to Tivicay and Descovy but it appears he was put on TRIUMEQ.  He then los his insurance and had no meds for past 2 months. He came in in mid February and had labs which surpisingly showed he was still with VL <20. He filled out paperwork for ADAP but it is not active yet.  He has had worsening rash on his elbows and legs and his topical steroid not working well for this and he asks about steroid injection   Lab Results  Component Value Date   HIV1RNAQUANT <20 DETECTED 03/18/2017   HIV1RNAQUANT 2,949 (H) 11/01/2015   HIV1RNAQUANT <20 02/23/2014     Lab Results  Component Value Date   CD4TABS 530 03/18/2017   CD4TABS 730 08/29/2016   CD4TABS 520 11/01/2015     Past Medical History:  Diagnosis Date  . Headache   . HIV infection (HCC)   . HTN (hypertension) 11/01/2015  . Hypertension   . Rash, skin    abdomen; treated w cream  . Wears glasses     Past Surgical History:  Procedure Laterality Date  . ANAL FISTULOTOMY N/A 02/03/2016   Procedure: ANAL FISTULOTOMY;  Surgeon: Romie Levee, MD;  Location: [facility];  Service: General;  Laterality: N/A;  . COLONOSCOPY    . RECTAL EXAM UNDER ANESTHESIA N/A 02/03/2016   Procedure: ANAL EXAM UNDER ANESTHESIA;  Surgeon: Romie Levee, MD;  Location: [facility];  Service: General;  Laterality: N/A;  . WISDOM TOOTH EXTRACTION      No family history on file.    Social History   Socioeconomic History  . Marital status: Single   Spouse name: None  . Number of children: None  . Years of education: None  . Highest education level: None  Social Needs  . Financial resource strain: None  . Food insecurity - worry: None  . Food insecurity - inability: None  . Transportation needs - medical: None  . Transportation needs - non-medical: None  Occupational History  . None  Tobacco Use  . Smoking status: Current Some Day Smoker    Packs/day: 0.10    Types: Cigarettes  . Smokeless tobacco: Never Used  . Tobacco comment: only smokes when drinking  Substance and Sexual Activity  . Alcohol use: Yes    Alcohol/week: 4.2 oz    Types: 7 Standard drinks or equivalent per week    Comment: wine, shots, beer on weekends  . Drug use: No    Comment: stopped 3 weeks  . Sexual activity: Yes    Partners: Male    Comment: accepted condoms  Other Topics Concern  . None  Social History Narrative  . None    No Known Allergies   Current Outpatient Medications:  .  abacavir-dolutegravir-lamiVUDine (TRIUMEQ) 600-50-300 MG tablet, Take 1 tablet by mouth daily., Disp: 30 tablet, Rfl: 6 .  betamethasone valerate ointment (VALISONE) 0.1 %, Apply 1 application topically 2 (two) times daily.  Only until rash gone. Then use as needed for flare ups., Disp: 30 g, Rfl: 1 .  oxyCODONE (OXY IR/ROXICODONE) 5 MG immediate release tablet, Take 1-2 tablets (5-10 mg total) by mouth every 6 (six) hours as needed., Disp: 30 tablet, Rfl: 0  Current Facility-Administered Medications:  .  0.9 %  sodium chloride infusion, 500 mL, Intravenous, Continuous, Danis, Starr LakeHenry L III, MD    Review of Systems  Constitutional: Negative for activity change, appetite change, chills, diaphoresis, fatigue, fever and unexpected weight change.  HENT: Negative for congestion, rhinorrhea, sinus pressure, sneezing, sore throat and trouble swallowing.   Eyes: Negative for photophobia and visual disturbance.  Respiratory: Negative for cough, chest tightness, shortness of  breath, wheezing and stridor.   Cardiovascular: Negative for chest pain, palpitations and leg swelling.  Gastrointestinal: Negative for abdominal distention, abdominal pain, anal bleeding, blood in stool, constipation, diarrhea, nausea and vomiting.  Genitourinary: Negative for difficulty urinating, dysuria, flank pain and hematuria.  Musculoskeletal: Negative for arthralgias, back pain, gait problem, joint swelling and myalgias.  Skin: Positive for rash. Negative for color change, pallor and wound.  Neurological: Negative for dizziness, tremors, weakness and light-headedness.  Hematological: Negative for adenopathy. Does not bruise/bleed easily.  Psychiatric/Behavioral: Negative for agitation, behavioral problems, confusion, decreased concentration and dysphoric mood.       Objective:   Physical Exam  Constitutional: He is oriented to person, place, and time. He appears well-developed and well-nourished. No distress.  HENT:  Head: Normocephalic and atraumatic.  Mouth/Throat: Oropharynx is clear and moist. No oropharyngeal exudate.  Eyes: Conjunctivae and EOM are normal. No scleral icterus.  Neck: Normal range of motion. Neck supple. No JVD present.  Cardiovascular: Normal rate and regular rhythm.  Pulmonary/Chest: Effort normal. No respiratory distress. He has no wheezes.  Abdominal: He exhibits no distension.  Musculoskeletal: He exhibits no edema or tenderness.  Lymphadenopathy:    He has no cervical adenopathy.  Neurological: He is alert and oriented to person, place, and time. He exhibits normal muscle tone. Coordination normal.  Skin: Skin is warm and dry. He is not diaphoretic. No erythema. No pallor.  Psychiatric: His behavior is normal. Judgment and thought content normal. His mood appears anxious. Abnormal recent memory: .25.          Assessment & Plan:   HIV: continue BIKTARVY and deploy [employer] pharmaceutical assistance program and have drug shipped to his house. I  will have him come back for repeat labs in a month and visit with me in 6 weeks  HTN: not well controlled at this point and needing to starting a medicine  Vitals:   04/01/17 1027  BP: (!) 142/98  Pulse: 69  Temp: 98.8 F (37.1 C)   Rash: check RPR with prozone. He can try topical steroid on elbows and arms with new script but beyond that will need to see a Dermatologist some how  I spent greater than 25 minutes with the patient including greater than 50% of time in face to face counsel of the patient re the need to always be on medications and that he should not fail with resistance if he simply runs out as he claims he did and explained how we can ensure he is never without meds but this is contingent on his alerting [place] in timely manner so we can deploy assistance plans and in coordination of his care with Case Management.

## 2017-04-01 NOTE — Addendum Note (Signed)
Addended by: Towanda OctaveGREEN, Annamaria Salah on: 04/01/2017 11:42 AM   Modules accepted: Orders

## 2017-04-02 ENCOUNTER — Encounter: Payer: Self-pay | Admitting: Infectious Disease

## 2017-04-02 LAB — CYTOLOGY, (ORAL, ANAL, URETHRAL) ANCILLARY ONLY
CHLAMYDIA, DNA PROBE: NEGATIVE
Chlamydia: NEGATIVE
NEISSERIA GONORRHEA: NEGATIVE
NEISSERIA GONORRHEA: NEGATIVE

## 2017-04-02 LAB — RPR: RPR Ser Ql: NONREACTIVE

## 2017-04-02 LAB — URINE CYTOLOGY ANCILLARY ONLY
CHLAMYDIA, DNA PROBE: NEGATIVE
NEISSERIA GONORRHEA: NEGATIVE

## 2017-04-29 ENCOUNTER — Other Ambulatory Visit: Payer: Self-pay | Admitting: Infectious Disease

## 2017-04-29 ENCOUNTER — Telehealth: Payer: Self-pay | Admitting: Pharmacist

## 2017-04-29 ENCOUNTER — Other Ambulatory Visit: Payer: Self-pay

## 2017-04-29 DIAGNOSIS — B20 Human immunodeficiency virus [HIV] disease: Secondary | ICD-10-CM

## 2017-04-29 DIAGNOSIS — Z113 Encounter for screening for infections with a predominantly sexual mode of transmission: Secondary | ICD-10-CM

## 2017-04-29 DIAGNOSIS — R21 Rash and other nonspecific skin eruption: Secondary | ICD-10-CM

## 2017-04-29 MED ORDER — BICTEGRAVIR-EMTRICITAB-TENOFOV 50-200-25 MG PO TABS: 1.0000 | ORAL_TABLET | Freq: Every day | ORAL | 11 refills | Status: DC

## 2017-04-29 NOTE — Telephone Encounter (Signed)
Thanks Cassie. It is amazing how little initiative some of our patients take to make sure they get their meds.

## 2017-04-29 NOTE — Telephone Encounter (Signed)
Patrick Reilly came in today because he had a lab appointment. He wasn't sure why he was here to get labs because he just had labs drawn on 2/25. Per Dr. Daiva EvesVan Dam, he didn't need labs. Patient also states he was supposed to follow-up with Morrie SheldonAshley regarding his medication but never did. He is approved for [employer] assistance until the end of the year. He has been out of meds.  Called [facility] and they are able to fill it (not sure why patient didn't try to fill?). [name] and set it up for mailing from then on out. He said he would.  I told him to make an appt with Dr. Daiva EvesVan Dam and he did.

## 2017-05-22 ENCOUNTER — Ambulatory Visit: Payer: Self-pay | Admitting: Infectious Disease

## 2017-06-04 ENCOUNTER — Encounter: Payer: Self-pay | Admitting: Infectious Disease

## 2017-06-04 ENCOUNTER — Ambulatory Visit (INDEPENDENT_AMBULATORY_CARE_PROVIDER_SITE_OTHER): Payer: Self-pay | Admitting: Infectious Disease

## 2017-06-04 VITALS — BP 154/92 | HR 85 | Temp 98.1°F | Ht 67.0 in | Wt 173.2 lb

## 2017-06-04 DIAGNOSIS — I10 Essential (primary) hypertension: Secondary | ICD-10-CM

## 2017-06-04 DIAGNOSIS — B20 Human immunodeficiency virus [HIV] disease: Secondary | ICD-10-CM

## 2017-06-04 DIAGNOSIS — Z23 Encounter for immunization: Secondary | ICD-10-CM

## 2017-06-04 MED ORDER — CLOTRIMAZOLE 1 % EX CREA: 1.0000 "application " | TOPICAL_CREAM | Freq: Two times a day (BID) | CUTANEOUS | 3 refills | Status: DC

## 2017-06-04 NOTE — Progress Notes (Signed)
Subjective:   Chief complaint:rash on abdomen, elbows and legs not better after steroids   Patient ID: Patrick Reilly, male    DOB: [birth year], 27 y.o.   MRN: [id number]  HPI  27 year old [place] man who HAD been  perfectly suppressed on his antiretroviral regimen of Atripla but who has not been seen by me for nearly 1.5 years. He then claimed when I saw him a year prior to last visit that he was taking his his ARV QOD. I checked VL which did not show VF with resistance fortunately.  I thought we had changed him to Tivicay and Descovy but it appears he was put on TRIUMEQ.  He then los his insurance and had no meds for past 2 months. He came in in mid February and had labs which surpisingly showed he was still with VL <20. He filled out paperwork for ADAP which was not active from the fall from when he lost insurance.  We engaged with [employer] assistance but he never got meds. He apparently got into an argument he says with front office staff. He claims to always pick up his phone and that we never called him. He also claims he called [facility] but no one returned his calls. FINALLY he got ADAP approved and he has been back on meds for roughly a month if not less.  His rash is not better with steroids.it is intensely pruritic.   Past Medical History:  Diagnosis Date  . Flexural eczema 04/01/2017  . Headache   . HIV infection (HCC)   . HTN (hypertension) 11/01/2015  . Hypertension   . Rash 04/01/2017  . Rash, skin    abdomen; treated w cream  . Wears glasses     Past Surgical History:  Procedure Laterality Date  . ANAL FISTULOTOMY N/A 02/03/2016   Procedure: ANAL FISTULOTOMY;  Surgeon: Romie Levee, MD;  Location: [facility];  Service: General;  Laterality: N/A;  . COLONOSCOPY    . RECTAL EXAM UNDER ANESTHESIA N/A 02/03/2016   Procedure: ANAL EXAM UNDER ANESTHESIA;  Surgeon: Romie Levee, MD;  Location: [facility];  Service: General;   Laterality: N/A;  . WISDOM TOOTH EXTRACTION      No family history on file.    Social History   Socioeconomic History  . Marital status: Single    Spouse name: Not on file  . Number of children: Not on file  . Years of education: Not on file  . Highest education level: Not on file  Occupational History  . Not on file  Social Needs  . Financial resource strain: Not on file  . Food insecurity:    Worry: Not on file    Inability: Not on file  . Transportation needs:    Medical: Not on file    Non-medical: Not on file  Tobacco Use  . Smoking status: Current Some Day Smoker    Packs/day: 0.10    Types: Cigarettes  . Smokeless tobacco: Never Used  . Tobacco comment: only smokes when drinking  Substance and Sexual Activity  . Alcohol use: Yes    Alcohol/week: 4.2 oz    Types: 7 Standard drinks or equivalent per week    Comment: wine, shots, beer on weekends  . Drug use: No    Frequency: 1.0 times per week    Types: Marijuana    Comment: stopped 3 weeks  . Sexual activity: Yes    Partners: Male  Comment: accepted condoms  Lifestyle  . Physical activity:    Days per week: Not on file    Minutes per session: Not on file  . Stress: Not on file  Relationships  . Social connections:    Talks on phone: Not on file    Gets together: Not on file    Attends religious service: Not on file    Active member of club or organization: Not on file    Attends meetings of clubs or organizations: Not on file    Relationship status: Not on file  Other Topics Concern  . Not on file  Social History Narrative  . Not on file    No Known Allergies   Current Outpatient Medications:  .  betamethasone valerate ointment (VALISONE) 0.1 %, Apply 1 application topically 2 (two) times daily. Only until rash gone. Then use as needed for flare ups., Disp: 30 g, Rfl: 1 .  bictegravir-emtricitabine-tenofovir AF (BIKTARVY) 50-200-25 MG TABS tablet, Take 1 tablet by mouth daily., Disp: 30  tablet, Rfl: 11 .  oxyCODONE (OXY IR/ROXICODONE) 5 MG immediate release tablet, Take 1-2 tablets (5-10 mg total) by mouth every 6 (six) hours as needed., Disp: 30 tablet, Rfl: 0  Current Facility-Administered Medications:  .  0.9 %  sodium chloride infusion, 500 mL, Intravenous, Continuous, Danis, Starr Lake III, MD    Review of Systems  Constitutional: Negative for activity change, appetite change, chills, diaphoresis, fatigue, fever and unexpected weight change.  HENT: Negative for congestion, rhinorrhea, sinus pressure, sneezing, sore throat and trouble swallowing.   Eyes: Negative for photophobia and visual disturbance.  Respiratory: Negative for cough, chest tightness, shortness of breath, wheezing and stridor.   Cardiovascular: Negative for chest pain, palpitations and leg swelling.  Gastrointestinal: Negative for abdominal distention, abdominal pain, anal bleeding, blood in stool, constipation, diarrhea, nausea and vomiting.  Genitourinary: Negative for difficulty urinating, dysuria, flank pain and hematuria.  Musculoskeletal: Negative for arthralgias, back pain, gait problem, joint swelling and myalgias.  Skin: Positive for rash. Negative for color change, pallor and wound.  Neurological: Negative for dizziness, tremors, weakness and light-headedness.  Hematological: Negative for adenopathy. Does not bruise/bleed easily.  Psychiatric/Behavioral: Negative for agitation, behavioral problems, confusion, decreased concentration and dysphoric mood.       Objective:   Physical Exam  Constitutional: He is oriented to person, place, and time. He appears well-developed and well-nourished. No distress.  HENT:  Head: Normocephalic and atraumatic.  Mouth/Throat: Oropharynx is clear and moist. No oropharyngeal exudate.  Eyes: Conjunctivae and EOM are normal. No scleral icterus.  Neck: Normal range of motion. Neck supple. No JVD present.  Cardiovascular: Normal rate and regular rhythm.    Pulmonary/Chest: Effort normal. No respiratory distress. He has no wheezes.  Abdominal: He exhibits no distension.  Musculoskeletal: He exhibits no edema or tenderness.  Lymphadenopathy:    He has no cervical adenopathy.  Neurological: He is alert and oriented to person, place, and time. He exhibits normal muscle tone. Coordination normal.  Skin: Skin is warm and dry. He is not diaphoretic. No erythema. No pallor.  Psychiatric: His behavior is normal. Judgment and thought content normal. His mood appears not anxious. Abnormal recent memory: .25.   Rash 06/04/17:               Assessment & Plan:   HIV: check labs on BIKTARVY <1 month. REALLY< REALLY need to have him get on top of ADAP renewal  HTN: not well controlled .again. It was better  several visits ago. Will delve into more Vitals:   06/04/17 1501  BP: (!) 154/92  Pulse: 85  Temp: 98.1 F (36.7 C)    Rash: try topical antifungal. If not responsive punch biopsy here vs referral to Dermatology  I spent greater than 25 minutes with the patient including greater than 50% of time in face to face counsel of the patient on the importance of high level adherence, getting ADAP on time  in coordination of his care. '

## 2017-06-06 LAB — T-HELPER CELL (CD4) - (RCID CLINIC ONLY)
CD4 % Helper T Cell: 27 % — ABNORMAL LOW (ref 33–55)
CD4 T Cell Abs: 740 /uL (ref 400–2700)

## 2017-06-09 LAB — HIV RNA, RTPCR W/R GT (RTI, PI,INT)
HIV 1 RNA Quant: <20 copies/mL
HIV-1 RNA Quant, Log: <1.3 Log copies/mL

## 2017-08-06 ENCOUNTER — Ambulatory Visit: Payer: Self-pay | Admitting: Infectious Disease

## 2017-08-09 ENCOUNTER — Ambulatory Visit: Payer: Self-pay | Admitting: Infectious Disease

## 2017-09-30 ENCOUNTER — Ambulatory Visit: Payer: Self-pay

## 2017-10-02 ENCOUNTER — Other Ambulatory Visit (INDEPENDENT_AMBULATORY_CARE_PROVIDER_SITE_OTHER): Payer: Self-pay

## 2017-10-02 NOTE — Progress Notes (Signed)
Pt presented on 09-30-17 for #3 HPV vaccine.  Patient tolerated procedure well.   Laurell Josephsammy K King, RN

## 2017-11-04 ENCOUNTER — Encounter: Payer: Self-pay | Admitting: Infectious Disease

## 2017-12-30 ENCOUNTER — Telehealth: Payer: Self-pay | Admitting: *Deleted

## 2017-12-30 NOTE — Telephone Encounter (Signed)
[facility] called, asking for clinic out reach as they are having difficulty reaching patient. RN called patient. He  Will call [facility]; is overdue for visit. Scheduled him for labs 11/26, follow up 12/13 with Dr Lennox LaityVan Dam. Habib Kise, Zachary GeorgeMichelle M, RN

## 2017-12-31 ENCOUNTER — Other Ambulatory Visit: Payer: Self-pay

## 2017-12-31 DIAGNOSIS — B20 Human immunodeficiency virus [HIV] disease: Secondary | ICD-10-CM

## 2018-01-01 LAB — T-HELPER CELL (CD4) - (RCID CLINIC ONLY)
CD4 % Helper T Cell: 28 % — ABNORMAL LOW (ref 33–55)
CD4 T Cell Abs: 670 /uL (ref 400–2700)

## 2018-01-01 LAB — MICROALBUMIN / CREATININE URINE RATIO
Creatinine, Urine: 171 mg/dL (ref 20–320)
Microalb Creat Ratio: 5 mcg/mg creat (ref <30)
Microalb, Ur: 0.8 mg/dL

## 2018-01-06 LAB — RPR: RPR Ser Ql: NONREACTIVE

## 2018-01-06 LAB — CBC WITH DIFFERENTIAL/PLATELET
Basophils Absolute: 11 cells/uL (ref 0–200)
Basophils Relative: 0.3 %
EOS ABS: 170 {cells}/uL (ref 15–500)
Eosinophils Relative: 4.6 %
HCT: 39.5 % (ref 38.5–50.0)
Hemoglobin: 13.3 g/dL (ref 13.2–17.1)
Lymphs Abs: 2305 cells/uL (ref 850–3900)
MCH: 27.5 pg (ref 27.0–33.0)
MCHC: 33.7 g/dL (ref 32.0–36.0)
MCV: 81.8 fL (ref 80.0–100.0)
MONOS PCT: 11.1 %
MPV: 10 fL (ref 7.5–12.5)
NEUTROS PCT: 21.7 %
Neutro Abs: 803 cells/uL — ABNORMAL LOW (ref 1500–7800)
PLATELETS: 198 10*3/uL (ref 140–400)
RBC: 4.83 10*6/uL (ref 4.20–5.80)
RDW: 13.8 % (ref 11.0–15.0)
TOTAL LYMPHOCYTE: 62.3 %
WBC mixed population: 411 cells/uL (ref 200–950)
WBC: 3.7 10*3/uL — AB (ref 3.8–10.8)

## 2018-01-06 LAB — COMPLETE METABOLIC PANEL WITH GFR
AG RATIO: 1.8 (calc) (ref 1.0–2.5)
ALBUMIN MSPROF: 4.6 g/dL (ref 3.6–5.1)
ALKALINE PHOSPHATASE (APISO): 60 U/L (ref 40–115)
ALT: 12 U/L (ref 9–46)
AST: 15 U/L (ref 10–40)
BUN: 15 mg/dL (ref 7–25)
CO2: 28 mmol/L (ref 20–32)
Calcium: 9.4 mg/dL (ref 8.6–10.3)
Chloride: 105 mmol/L (ref 98–110)
Creat: 0.96 mg/dL (ref 0.60–1.35)
GFR, Est African American: 123 mL/min/{1.73_m2} (ref >=60)
GFR, Est Non African American: 106 mL/min/{1.73_m2} (ref >=60)
GLOBULIN: 2.6 g/dL (ref 1.9–3.7)
Glucose, Bld: 88 mg/dL (ref 65–99)
POTASSIUM: 4 mmol/L (ref 3.5–5.3)
SODIUM: 139 mmol/L (ref 135–146)
Total Bilirubin: 0.4 mg/dL (ref 0.2–1.2)
Total Protein: 7.2 g/dL (ref 6.1–8.1)

## 2018-01-06 LAB — HIV-1 RNA QUANT-NO REFLEX-BLD
HIV 1 RNA Quant: <20 copies/mL — AB
HIV-1 RNA QUANT, LOG: DETECTED {Log_copies}/mL — AB

## 2018-01-17 ENCOUNTER — Ambulatory Visit (INDEPENDENT_AMBULATORY_CARE_PROVIDER_SITE_OTHER): Payer: Self-pay | Admitting: Infectious Disease

## 2018-01-17 ENCOUNTER — Encounter: Payer: Self-pay | Admitting: Infectious Disease

## 2018-01-17 VITALS — BP 138/83 | HR 76 | Temp 98.4°F | Wt 174.0 lb

## 2018-01-17 DIAGNOSIS — B20 Human immunodeficiency virus [HIV] disease: Secondary | ICD-10-CM

## 2018-01-17 DIAGNOSIS — Z23 Encounter for immunization: Secondary | ICD-10-CM

## 2018-01-17 DIAGNOSIS — L308 Other specified dermatitis: Secondary | ICD-10-CM

## 2018-01-17 DIAGNOSIS — L309 Dermatitis, unspecified: Secondary | ICD-10-CM

## 2018-01-17 DIAGNOSIS — I10 Essential (primary) hypertension: Secondary | ICD-10-CM

## 2018-01-17 HISTORY — DX: Dermatitis, unspecified: L30.9

## 2018-01-17 MED ORDER — TRIAMCINOLONE ACETONIDE 0.5 % EX OINT: 1.0000 "application " | TOPICAL_OINTMENT | Freq: Two times a day (BID) | CUTANEOUS | 5 refills | Status: DC

## 2018-01-17 NOTE — Progress Notes (Signed)
Subjective:   Chief complaint: Continued problem with rash   Patient ID: Patrick Reilly, male    DOB: 09-12-1990, 27 y.o.   MRN: [id number]  HPI  27 year old [place] man who HAD been  perfectly suppressed on his antiretroviral regimen of Atripla but who has not been seen by me for nearly 1.5 years. He then claimed when I saw him a year prior to last visit that he was taking his his ARV QOD. I checked VL which did not show VF with resistance fortunately.  I thought we had changed him to Tivicay and Descovy but it appears he was put on TRIUMEQ.  He eventually was changed over to [place].  In the interim he was been struggling with a rash which to me is always seemed like eczema on exam.  I have given him triamcinolone in the past but apparently this drug was taken off the ADAP formulary.  Therefore he has not been able to afford it at times and I suspect that is the reason why it "did not work".  Antifungals also did not help with his rash.  He has some concern if it may be related to his antiretrovirals but have a hard time believing that he has had this rash for the last several years due to an antegrade strand transfer inhibitor.  Its appearance very much looks like something with eczema.  Only found out that this drug was off the formulary with ADAP we did find that we could obtain it at [facility] for him for $9 and I am sending a prescription there instead.  He has remained virologically suppressed on his Biktarvy but does need to remove renew his [employer] P program  Past Medical History:  Diagnosis Date  . Flexural eczema 04/01/2017  . Headache   . HIV infection (HCC)   . HTN (hypertension) 11/01/2015  . Hypertension   . Rash 04/01/2017  . Rash, skin    abdomen; treated w cream  . Wears glasses     Past Surgical History:  Procedure Laterality Date  . ANAL FISTULOTOMY N/A 02/03/2016   Procedure: ANAL FISTULOTOMY;  Surgeon: Romie Levee, MD;  Location: [facility];  Service: General;  Laterality: N/A;  . COLONOSCOPY    . RECTAL EXAM UNDER ANESTHESIA N/A 02/03/2016   Procedure: ANAL EXAM UNDER ANESTHESIA;  Surgeon: Romie Levee, MD;  Location: [facility];  Service: General;  Laterality: N/A;  . WISDOM TOOTH EXTRACTION      No family history on file.    Social History   Socioeconomic History  . Marital status: Single    Spouse name: Not on file  . Number of children: Not on file  . Years of education: Not on file  . Highest education level: Not on file  Occupational History  . Not on file  Social Needs  . Financial resource strain: Not on file  . Food insecurity:    Worry: Not on file    Inability: Not on file  . Transportation needs:    Medical: Not on file    Non-medical: Not on file  Tobacco Use  . Smoking status: Current Some Day Smoker    Packs/day: 0.10    Types: Cigarettes  . Smokeless tobacco: Never Used  . Tobacco comment: only smokes when drinking  Substance and Sexual Activity  . Alcohol use: Yes    Alcohol/week: 7.0 standard drinks    Types: 7 Standard drinks or equivalent per week  Comment: wine, shots, beer on weekends  . Drug use: No    Frequency: 1.0 times per week    Types: Marijuana    Comment: stopped 3 weeks  . Sexual activity: Yes    Partners: Male    Comment: accepted condoms  Lifestyle  . Physical activity:    Days per week: Not on file    Minutes per session: Not on file  . Stress: Not on file  Relationships  . Social connections:    Talks on phone: Not on file    Gets together: Not on file    Attends religious service: Not on file    Active member of club or organization: Not on file    Attends meetings of clubs or organizations: Not on file    Relationship status: Not on file  Other Topics Concern  . Not on file  Social History Narrative  . Not on file    No Known Allergies   Current Outpatient Medications:  .  betamethasone valerate ointment (VALISONE) 0.1  %, Apply 1 application topically 2 (two) times daily. Only until rash gone. Then use as needed for flare ups., Disp: 30 g, Rfl: 1 .  bictegravir-emtricitabine-tenofovir AF (BIKTARVY) 50-200-25 MG TABS tablet, Take 1 tablet by mouth daily., Disp: 30 tablet, Rfl: 11 .  clotrimazole (LOTRIMIN) 1 % cream, Apply 1 application topically 2 (two) times daily., Disp: 30 g, Rfl: 3  Current Facility-Administered Medications:  .  0.9 %  sodium chloride infusion, 500 mL, Intravenous, Continuous, Danis, Starr LakeHenry L III, MD    Review of Systems  Constitutional: Negative for activity change, appetite change, chills, diaphoresis, fatigue, fever and unexpected weight change.  HENT: Negative for congestion, rhinorrhea, sinus pressure, sneezing, sore throat and trouble swallowing.   Eyes: Negative for photophobia and visual disturbance.  Respiratory: Negative for cough, chest tightness, shortness of breath, wheezing and stridor.   Cardiovascular: Negative for chest pain, palpitations and leg swelling.  Gastrointestinal: Negative for abdominal distention, abdominal pain, anal bleeding, blood in stool, constipation, diarrhea, nausea and vomiting.  Genitourinary: Negative for difficulty urinating, dysuria, flank pain and hematuria.  Musculoskeletal: Negative for arthralgias, back pain, gait problem, joint swelling and myalgias.  Skin: Positive for rash. Negative for color change, pallor and wound.  Neurological: Negative for dizziness, tremors, weakness and light-headedness.  Hematological: Negative for adenopathy. Does not bruise/bleed easily.  Psychiatric/Behavioral: Negative for agitation, behavioral problems, confusion, decreased concentration and dysphoric mood.       Objective:   Physical Exam  Constitutional: He is oriented to person, place, and time. He appears well-developed and well-nourished. No distress.  HENT:  Head: Normocephalic and atraumatic.  Mouth/Throat: Oropharynx is clear and moist. No  oropharyngeal exudate.  Eyes: Conjunctivae and EOM are normal. No scleral icterus.  Neck: Normal range of motion. Neck supple. No JVD present.  Cardiovascular: Normal rate and regular rhythm.  Pulmonary/Chest: Effort normal. No respiratory distress. He has no wheezes.  Abdominal: He exhibits no distension.  Musculoskeletal:        General: No tenderness or edema.  Lymphadenopathy:    He has no cervical adenopathy.  Neurological: He is alert and oriented to person, place, and time. He exhibits normal muscle tone. Coordination normal.  Skin: Skin is warm and dry. Rash noted. He is not diaphoretic. No erythema. No pallor.  Psychiatric: His behavior is normal. Judgment and thought content normal. His mood appears anxious. Abnormal recent memory: .25.   Rash 06/04/17:  Today 01/17/2018:               Assessment & Plan:   Worsening rash: This very much looks like eczema to me and I think the problem is that he has not been able to get consistent corticosteroids and consistent emollients applied to the areas.  We have sent a prescription for triamcinolone to [facility] pharmacy where he can obtain it for $9.  Also instructed him to purchase an emollient cream such as Eucerin as recommended by the pharmacy.    HIV: Continue BIKTARVY if we really are convinced that his rash is related to this we can always change him to another single tablet regimen which he would prefer such as Symtuza or potentially Delstrigo or less desired by me Odefsey   HTN: Under control today. Vitals:   01/17/18 1031  BP: 138/83  Pulse: 76  Temp: 98.4 F (36.9 C)    I spent greater than 25 minutes with the patient including greater than 50% of time in face to face counsel of the patient during various antiretroviral options to his current 1 strength and weaknesses of each of these regarding the work-up of his rash and in coordination of care with pharmacy.

## 2018-02-03 MED FILL — TRIAMCINOLONE 0.5% OINTMENT: 0.5 | 7 days supply | Qty: 15 | Fill #0

## 2018-02-14 ENCOUNTER — Encounter: Payer: Self-pay | Admitting: Infectious Disease

## 2018-02-14 ENCOUNTER — Ambulatory Visit (INDEPENDENT_AMBULATORY_CARE_PROVIDER_SITE_OTHER): Payer: Self-pay | Admitting: Infectious Disease

## 2018-02-14 VITALS — BP 146/92 | HR 76 | Temp 98.6°F

## 2018-02-14 DIAGNOSIS — L2082 Flexural eczema: Secondary | ICD-10-CM

## 2018-02-14 DIAGNOSIS — B20 Human immunodeficiency virus [HIV] disease: Secondary | ICD-10-CM

## 2018-02-14 DIAGNOSIS — R21 Rash and other nonspecific skin eruption: Secondary | ICD-10-CM

## 2018-02-14 NOTE — Progress Notes (Signed)
Subjective:   Chief complaint: Rash not much better   Patient ID: Patrick Reilly, male    DOB: 1990-08-17, 28 y.o.   MRN: [id number]  HPI  28 year old [place] man who HAD been  perfectly suppressed on his antiretroviral regimen of Atripla but who has not been seen by me for nearly 1.5 years. He then claimed when I saw him a year prior to last visit that he was taking his his ARV QOD. I checked VL which did not show VF with resistance fortunately.  I thought we had changed him to Tivicay and Descovy but it appears he was put on TRIUMEQ.  He eventually was changed over to Leroy.  In the interim he was been struggling with a rash which to me is always seemed like eczema on exam.  I have given him triamcinolone in the past but apparently this drug was taken off the [employer] formulary.  Therefore he has not been able to afford it at times and I suspect that is the reason why it "did not work".  Antifungals also did not help with his rash.  He has some concern if it may be related to his antiretrovirals but have a hard time believing that he has had this rash for the last several years due to an antegrade strand transfer inhibitor.  Its appearance very much looks like something with eczema.  Only found out that this drug was off the formulary with [employer] we did find that we could obtain it at [facility] for him for $9 and I am sent a prescription there instead.  He has remained virologically suppressed on his Biktarvy but does need to  renew his HMA P program  Since I last saw [place] he is only been on the corticosteroid for 30 weeks which I do not understand since it seem to be several weeks ago that I saw him.  He has not noticed a great deal of improvement so far.    Past Medical History:  Diagnosis Date  . Eczema 01/17/2018  . Flexural eczema 04/01/2017  . Headache   . HIV infection (HCC)   . HTN (hypertension) 11/01/2015  . Hypertension   . Rash 04/01/2017  . Rash, skin    abdomen;  treated w cream  . Wears glasses     Past Surgical History:  Procedure Laterality Date  . ANAL FISTULOTOMY N/A 02/03/2016   Procedure: ANAL FISTULOTOMY;  Surgeon: Romie Levee, MD;  [facility];  Service: General;  Laterality: N/A;  . COLONOSCOPY    . RECTAL EXAM UNDER ANESTHESIA N/A 02/03/2016   Procedure: ANAL EXAM UNDER ANESTHESIA;  Surgeon: Romie Levee, MD;  Location: [facility] [place];  Service: General;  Laterality: N/A;  . WISDOM TOOTH EXTRACTION      No family history on file.    Social History   Socioeconomic History  . Marital status: Single    Spouse name: Not on file  . Number of children: Not on file  . Years of education: Not on file  . Highest education level: Not on file  Occupational History  . Not on file  Social Needs  . Financial resource strain: Not on file  . Food insecurity:    Worry: Not on file    Inability: Not on file  . Transportation needs:    Medical: Not on file    Non-medical: Not on file  Tobacco Use  . Smoking status: Current Some Day Smoker  Packs/day: 0.10    Types: Cigarettes  . Smokeless tobacco: Never Used  . Tobacco comment: only smokes when drinking  Substance and Sexual Activity  . Alcohol use: Yes    Alcohol/week: 7.0 standard drinks    Types: 7 Standard drinks or equivalent per week    Comment: wine, shots, beer on weekends  . Drug use: No    Frequency: 1.0 times per week    Types: Marijuana    Comment: stopped 3 weeks  . Sexual activity: Yes    Partners: Male    Comment: accepted condoms  Lifestyle  . Physical activity:    Days per week: Not on file    Minutes per session: Not on file  . Stress: Not on file  Relationships  . Social connections:    Talks on phone: Not on file    Gets together: Not on file    Attends religious service: Not on file    Active member of club or organization: Not on file    Attends meetings of clubs or organizations: Not on file     Relationship status: Not on file  Other Topics Concern  . Not on file  Social History Narrative  . Not on file    No Known Allergies   Current Outpatient Medications:  .  bictegravir-emtricitabine-tenofovir AF (BIKTARVY) 50-200-25 MG TABS tablet, Take 1 tablet by mouth daily., Disp: 30 tablet, Rfl: 11 .  triamcinolone ointment (KENALOG) 0.5 %, Apply 1 application topically 2 (two) times daily., Disp: 15 g, Rfl: 5  Current Facility-Administered Medications:  .  0.9 %  sodium chloride infusion, 500 mL, Intravenous, Continuous, Danis, Starr LakeHenry L III, MD    Review of Systems  Constitutional: Negative for activity change, appetite change, chills, diaphoresis, fatigue, fever and unexpected weight change.  HENT: Negative for congestion, rhinorrhea, sinus pressure, sneezing, sore throat and trouble swallowing.   Eyes: Negative for photophobia and visual disturbance.  Respiratory: Negative for cough, chest tightness, shortness of breath, wheezing and stridor.   Cardiovascular: Negative for chest pain, palpitations and leg swelling.  Gastrointestinal: Negative for abdominal distention, abdominal pain, anal bleeding, blood in stool, constipation, diarrhea, nausea and vomiting.  Genitourinary: Negative for difficulty urinating, dysuria, flank pain and hematuria.  Musculoskeletal: Negative for arthralgias, back pain, gait problem, joint swelling and myalgias.  Skin: Positive for rash. Negative for color change, pallor and wound.  Neurological: Negative for dizziness, tremors, weakness and light-headedness.  Hematological: Negative for adenopathy. Does not bruise/bleed easily.  Psychiatric/Behavioral: Negative for agitation, behavioral problems, confusion, decreased concentration and dysphoric mood.       Objective:   Physical Exam  Constitutional: He is oriented to person, place, and time. He appears well-developed and well-nourished. No distress.  HENT:  Head: Normocephalic and atraumatic.    Mouth/Throat: Oropharynx is clear and moist. No oropharyngeal exudate.  Eyes: Conjunctivae and EOM are normal. No scleral icterus.  Neck: Normal range of motion. Neck supple. No JVD present.  Cardiovascular: Normal rate and regular rhythm.  Pulmonary/Chest: Effort normal. No respiratory distress. He has no wheezes.  Abdominal: He exhibits no distension.  Musculoskeletal:        General: No tenderness or edema.  Lymphadenopathy:    He has no cervical adenopathy.  Neurological: He is alert and oriented to person, place, and time. He exhibits normal muscle tone. Coordination normal.  Skin: Skin is warm and dry. Rash noted. He is not diaphoretic. No erythema. No pallor.  Psychiatric: His behavior is normal. Judgment and  thought content normal. His mood appears anxious. Abnormal recent memory: .25.   Rash 06/04/17:        Today 01/17/2018:         February 14, 2018:            Assessment & Plan:   Persistent rash this is a very much looks like eczema to me and I thought the problem is that he has not been able to get consistent corticosteroids and consistent emollients applied to the areas.  [facility] he [facility] obtain it for $9.    Not noticed much of a difference yet.  Therefore we will continue the corticosteroids and hopefully is also using topical emollients I will see him back on March 07, 2018.  If the rash is not improving I will perform a punch biopsy at that time and will consider changing to antifungal therapy   HIV: Continue BIKTARVY if we really are convinced that his rash is related to this we can always change him to another single tablet regimen which he would prefer such as Symtuza or potentially Delstrigo or less desired by me [facility]

## 2018-02-23 ENCOUNTER — Other Ambulatory Visit: Payer: Self-pay

## 2018-02-23 ENCOUNTER — Encounter (HOSPITAL_COMMUNITY): Payer: Self-pay | Admitting: Emergency Medicine

## 2018-02-23 ENCOUNTER — Emergency Department (HOSPITAL_COMMUNITY)
Admission: EM | Admit: 2018-02-23 | Discharge: 2018-02-23 | Disposition: A | Discharge status: Home / Self Care | Payer: Self-pay | Attending: Emergency Medicine | Admitting: Emergency Medicine

## 2018-02-23 ENCOUNTER — Emergency Department (HOSPITAL_COMMUNITY): Payer: Self-pay

## 2018-02-23 DIAGNOSIS — R112 Nausea with vomiting, unspecified: Secondary | ICD-10-CM | POA: Insufficient documentation

## 2018-02-23 DIAGNOSIS — K5289 Other specified noninfective gastroenteritis and colitis: Secondary | ICD-10-CM | POA: Insufficient documentation

## 2018-02-23 DIAGNOSIS — E860 Dehydration: Secondary | ICD-10-CM | POA: Insufficient documentation

## 2018-02-23 DIAGNOSIS — F17210 Nicotine dependence, cigarettes, uncomplicated: Secondary | ICD-10-CM | POA: Insufficient documentation

## 2018-02-23 DIAGNOSIS — R109 Unspecified abdominal pain: Secondary | ICD-10-CM | POA: Insufficient documentation

## 2018-02-23 DIAGNOSIS — B20 Human immunodeficiency virus [HIV] disease: Secondary | ICD-10-CM | POA: Insufficient documentation

## 2018-02-23 DIAGNOSIS — I10 Essential (primary) hypertension: Secondary | ICD-10-CM | POA: Insufficient documentation

## 2018-02-23 DIAGNOSIS — K529 Noninfective gastroenteritis and colitis, unspecified: Secondary | ICD-10-CM

## 2018-02-23 DIAGNOSIS — Z79899 Other long term (current) drug therapy: Secondary | ICD-10-CM | POA: Insufficient documentation

## 2018-02-23 LAB — COMPREHENSIVE METABOLIC PANEL
ALK PHOS: 57 U/L (ref 38–126)
ALT: 20 U/L (ref 0–44)
AST: 27 U/L (ref 15–41)
Albumin: 5.3 g/dL — ABNORMAL HIGH (ref 3.5–5.0)
Anion gap: 13 (ref 5–15)
BILIRUBIN TOTAL: 0.6 mg/dL (ref 0.3–1.2)
BUN: 14 mg/dL (ref 6–20)
CO2: 25 mmol/L (ref 22–32)
CREATININE: 1.03 mg/dL (ref 0.61–1.24)
Calcium: 10 mg/dL (ref 8.9–10.3)
Chloride: 103 mmol/L (ref 98–111)
GFR calc Af Amer: >60 mL/min (ref >60)
GFR calc non Af Amer: >60 mL/min (ref >60)
Glucose, Bld: 106 mg/dL — ABNORMAL HIGH (ref 70–99)
Potassium: 3.9 mmol/L (ref 3.5–5.1)
Sodium: 141 mmol/L (ref 135–145)
Total Protein: 9.1 g/dL — ABNORMAL HIGH (ref 6.5–8.1)

## 2018-02-23 LAB — CBC
HCT: 47.4 % (ref 39.0–52.0)
HEMOGLOBIN: 14.9 g/dL (ref 13.0–17.0)
MCH: 27 pg (ref 26.0–34.0)
MCHC: 31.4 g/dL (ref 30.0–36.0)
MCV: 86 fL (ref 80.0–100.0)
Platelets: 198 10*3/uL (ref 150–400)
RBC: 5.51 MIL/uL (ref 4.22–5.81)
RDW: 14.6 % (ref 11.5–15.5)
WBC: 8.6 10*3/uL (ref 4.0–10.5)
nRBC: 0 % (ref 0.0–0.2)

## 2018-02-23 LAB — LIPASE, BLOOD: Lipase: 26 U/L (ref 11–51)

## 2018-02-23 MED ORDER — SODIUM CHLORIDE (PF) 0.9 % IJ SOLN
INTRAMUSCULAR | Status: AC
  Filled 2018-02-23: qty 50

## 2018-02-23 MED ORDER — ACETAMINOPHEN 325 MG PO TABS
650.0000 mg | ORAL_TABLET | Freq: Once | ORAL | Status: AC
  Administered 2018-02-23: 650 mg via ORAL
  Filled 2018-02-23: qty 2

## 2018-02-23 MED ORDER — ONDANSETRON 8 MG PO TBDP: 8.0000 mg | ORAL_TABLET | Freq: Three times a day (TID) | ORAL | 0 refills | Status: DC | PRN

## 2018-02-23 MED ORDER — SODIUM CHLORIDE 0.9 % IV BOLUS
1000.0000 mL | Freq: Once | INTRAVENOUS | Status: AC
  Administered 2018-02-23: 1000 mL via INTRAVENOUS

## 2018-02-23 MED ORDER — METRONIDAZOLE 500 MG PO TABS: 500.0000 mg | ORAL_TABLET | Freq: Two times a day (BID) | ORAL | 0 refills | Status: DC

## 2018-02-23 MED ORDER — ONDANSETRON HCL 4 MG/2ML IJ SOLN
4.0000 mg | Freq: Once | INTRAMUSCULAR | Status: AC
  Administered 2018-02-23: 4 mg via INTRAVENOUS
  Filled 2018-02-23: qty 2

## 2018-02-23 MED ORDER — IOPAMIDOL (ISOVUE-300) INJECTION 61%
INTRAVENOUS | Status: AC
  Filled 2018-02-23: qty 100

## 2018-02-23 MED ORDER — IOPAMIDOL (ISOVUE-300) INJECTION 61%
100.0000 mL | Freq: Once | INTRAVENOUS | Status: AC | PRN
  Administered 2018-02-23: 100 mL via INTRAVENOUS

## 2018-02-23 NOTE — ED Provider Notes (Signed)
[facility]-EMERGENCY DEPT Provider Note   CSN: [id number] Arrival date & time: 02/23/18  1749     History   Chief Complaint Chief Complaint  Patient presents with  . Emesis    HPI Patrick Reilly is a 28 y.o. male.  HPI Patient is a 28 year old male presents the emergency department with left-sided abdominal pain worsening through the day with associated nausea vomiting.  No diarrhea.  No fevers but does report some chills.  Reports decreased oral intake.  History of HIV.  Compliant with medications.  Symptoms are moderate in severity.  Decreased oral intake today.   Past Medical History:  Diagnosis Date  . Eczema 01/17/2018  . Flexural eczema 04/01/2017  . Headache   . HIV infection (HCC)   . HTN (hypertension) 11/01/2015  . Hypertension   . Rash 04/01/2017  . Rash, skin    abdomen; treated w cream  . Wears glasses     Patient Active Problem List   Diagnosis Date Noted  . Eczema 01/17/2018  . Rash 04/01/2017  . Flexural eczema 04/01/2017  . HTN (hypertension) 11/01/2015  . Athlete's foot 03/04/2014  . Penile discharge 03/04/2014  . Genital condyloma, male 06/04/2011  . Abscess of gluteal region 06/04/2011  . Marijuana use 05/02/2011  . Depression 05/02/2011  . Alcohol use 05/02/2011  . Human immunodeficiency virus (HIV) disease (HCC) 12/21/2008    Past Surgical History:  Procedure Laterality Date  . ANAL FISTULOTOMY N/A 02/03/2016   Procedure: ANAL FISTULOTOMY;  Surgeon: Romie LeveeAlicia Thomas, MD;  [facility] [place];  Service: General;  Laterality: N/A;  . COLONOSCOPY    . RECTAL EXAM UNDER ANESTHESIA N/A 02/03/2016   Procedure: ANAL EXAM UNDER ANESTHESIA;  Surgeon: Romie LeveeAlicia Thomas, MD;  [facility] [place];  Service: General;  Laterality: N/A;  . WISDOM TOOTH EXTRACTION          Home Medications    Prior to Admission medications   Medication Sig Start Date End Date Taking? Authorizing Provider    bictegravir-emtricitabine-tenofovir AF (BIKTARVY) 50-200-25 MG TABS tablet Take 1 tablet by mouth daily. 04/29/17  Yes Daiva EvesVan Dam, Lisette Grinderornelius N, MD  metroNIDAZOLE (FLAGYL) 500 MG tablet Take 1 tablet (500 mg total) by mouth 2 (two) times daily. 02/23/18   Azalia Bilisampos, Vega Withrow, MD  ondansetron (ZOFRAN ODT) 8 MG disintegrating tablet Take 1 tablet (8 mg total) by mouth every 8 (eight) hours as needed for nausea or vomiting. 02/23/18   Azalia Bilisampos, Evoleht Hovatter, MD  triamcinolone ointment (KENALOG) 0.5 % Apply 1 application topically 2 (two) times daily. Patient not taking: Reported on 02/23/2018 01/17/18   Daiva EvesVan Dam, Lisette Grinderornelius N, MD    Family History No family history on file.  Social History Social History   Tobacco Use  . Smoking status: Current Some Day Smoker    Packs/day: 0.10    Types: Cigarettes  . Smokeless tobacco: Never Used  . Tobacco comment: only smokes when drinking  Substance Use Topics  . Alcohol use: Yes    Alcohol/week: 7.0 standard drinks    Types: 7 Standard drinks or equivalent per week    Comment: wine, shots, beer on weekends  . Drug use: No    Frequency: 1.0 times per week    Types: Marijuana    Comment: stopped 3 weeks     Allergies   Patient has no known allergies.   Review of Systems Review of Systems  All other systems reviewed and are negative.    Physical Exam Updated  Vital Signs BP (!) 138/93 (BP Location: Left Arm)   Pulse 70   Temp 98.3 F (36.8 C) (Oral)   Resp 15   SpO2 100%   Physical Exam Vitals signs and nursing note reviewed.  Constitutional:      Appearance: He is well-developed.  HENT:     Head: Normocephalic and atraumatic.  Neck:     Musculoskeletal: Normal range of motion.  Cardiovascular:     Rate and Rhythm: Normal rate and regular rhythm.     Heart sounds: Normal heart sounds.  Pulmonary:     Effort: Pulmonary effort is normal. No respiratory distress.     Breath sounds: Normal breath sounds.  Abdominal:     General: There is no  distension.     Palpations: Abdomen is soft.     Comments: Left-sided abdominal tenderness  Musculoskeletal: Normal range of motion.  Skin:    General: Skin is warm and dry.  Neurological:     Mental Status: He is alert and oriented to person, place, and time.  Psychiatric:        Judgment: Judgment normal.      ED Treatments / Results  Labs (all labs ordered are listed, but only abnormal results are displayed) Labs Reviewed  COMPREHENSIVE METABOLIC PANEL - Abnormal; Notable for the following components:      Result Value   Glucose, Bld 106 (*)    Total Protein 9.1 (*)    Albumin 5.3 (*)    All other components within normal limits  CBC  LIPASE, BLOOD    EKG None  Radiology Ct Abdomen Pelvis W Contrast  Result Date: 02/23/2018 CLINICAL DATA:  Abdomen pain for 14 hours EXAM: CT ABDOMEN AND PELVIS WITH CONTRAST TECHNIQUE: Multidetector CT imaging of the abdomen and pelvis was performed using the standard protocol following bolus administration of intravenous contrast. CONTRAST:  ISOVUE-300 IOPAMIDOL (ISOVUE-300) INJECTION 61% COMPARISON:  December 20, 2015 FINDINGS: Lower chest: No acute abnormality. Hepatobiliary: No focal liver abnormality is seen. No gallstones, gallbladder wall thickening, or biliary dilatation. Pancreas: Unremarkable. No pancreatic ductal dilatation or surrounding inflammatory changes. Spleen: Normal in size without focal abnormality. Adrenals/Urinary Tract: Adrenal glands are unremarkable. Kidneys are normal, without renal calculi, focal lesion, or hydronephrosis. Bladder is unremarkable. Stomach/Bowel: There is diffuse bowel wall thickening of the distal ascending colon and transverse colon. There is no small bowel obstruction. The appendix is not seen but no inflammation is noted around cecum. The stomach is normal. Vascular/Lymphatic: No significant vascular findings are present. No enlarged abdominal or pelvic lymph nodes. Reproductive: Prostate is  unremarkable. Other: Minimal umbilical herniation of mesenteric fat is identified. Musculoskeletal: No acute abnormality. IMPRESSION: Diffuse bowel wall thickening of the distal ascending colon and transverse colon. This is nonspecific but can be seen in colitis, favor infectious/inflammatory etiology. Electronically Signed   By: Sherian Rein M.D.   On: 02/23/2018 20:39    Procedures Procedures (including critical care time)  Medications Ordered in ED Medications  iopamidol (ISOVUE-300) 61 % injection (has no administration in time range)  sodium chloride (PF) 0.9 % injection (has no administration in time range)  sodium chloride 0.9 % bolus 1,000 mL (0 mLs Intravenous Stopped 02/23/18 1949)  ondansetron (ZOFRAN) injection 4 mg (4 mg Intravenous Given 02/23/18 1858)  acetaminophen (TYLENOL) tablet 650 mg (650 mg Oral Given 02/23/18 1941)  iopamidol (ISOVUE-300) 61 % injection 100 mL (100 mLs Intravenous Contrast Given 02/23/18 1950)     Initial Impression / Assessment and Plan /  ED Course  I have reviewed the triage vital signs and the nursing notes.  Pertinent labs & imaging results that were available during my care of the patient were reviewed by me and considered in my medical decision making (see chart for details).     Feels much better after IV fluids and antiemetics.  Patient with evidence of acute colitis on CT imaging.  He just had an episode of diarrhea as well.  Patient will be treated with Flagyl and Zofran.  He understands the importance of close primary care follow-up.  He understands to follow-up with GI if symptoms or not improving.  He understands to return to the emergency department for new or worsening symptoms.  All questions answered.  Stable for discharge.  Final Clinical Impressions(s) / ED Diagnoses   Final diagnoses:  Acute abdominal pain  Acute colitis  Nausea and vomiting, intractability of vomiting not specified, unspecified vomiting type  Acute dehydration      ED Discharge Orders         Ordered    ondansetron (ZOFRAN ODT) 8 MG disintegrating tablet  Every 8 hours PRN     02/23/18 2132    metroNIDAZOLE (FLAGYL) 500 MG tablet  2 times daily     02/23/18 2132           Azalia Bilis, MD 02/23/18 2135

## 2018-02-23 NOTE — ED Notes (Signed)
Bed: WA25 Expected date:  Expected time:  Means of arrival:  Comments: Triage 

## 2018-02-23 NOTE — ED Notes (Signed)
Pt's heart rate in the 40's during triage. Pt's heart rate then increased to 60, returned to lower 50's.

## 2018-02-23 NOTE — ED Triage Notes (Signed)
Pt with multiple episodes of  Vomiting since 5am this morning. Pt states abdomen is tender.

## 2018-02-23 NOTE — ED Notes (Signed)
Patient transported to CT via stretcher. His friend Onalee Hua is at bedside.

## 2018-03-07 ENCOUNTER — Ambulatory Visit (INDEPENDENT_AMBULATORY_CARE_PROVIDER_SITE_OTHER): Payer: Self-pay | Admitting: Infectious Disease

## 2018-03-07 ENCOUNTER — Other Ambulatory Visit: Payer: Self-pay | Admitting: Infectious Disease

## 2018-03-07 ENCOUNTER — Encounter: Payer: Self-pay | Admitting: Infectious Disease

## 2018-03-07 VITALS — BP 133/86 | HR 65 | Temp 98.3°F | Wt 174.0 lb

## 2018-03-07 DIAGNOSIS — L308 Other specified dermatitis: Secondary | ICD-10-CM

## 2018-03-07 DIAGNOSIS — L2082 Flexural eczema: Secondary | ICD-10-CM

## 2018-03-07 DIAGNOSIS — I10 Essential (primary) hypertension: Secondary | ICD-10-CM

## 2018-03-07 DIAGNOSIS — Z21 Asymptomatic human immunodeficiency virus [HIV] infection status: Secondary | ICD-10-CM

## 2018-03-07 DIAGNOSIS — B20 Human immunodeficiency virus [HIV] disease: Secondary | ICD-10-CM

## 2018-03-07 DIAGNOSIS — Z79899 Other long term (current) drug therapy: Secondary | ICD-10-CM

## 2018-03-07 DIAGNOSIS — R21 Rash and other nonspecific skin eruption: Secondary | ICD-10-CM

## 2018-03-07 NOTE — Progress Notes (Signed)
Subjective:   Chief complaint: Rash still not much improved   Patient ID: Patrick Reilly, male    DOB: 12-25-1990, 28 y.o.   MRN: [id number]  HPI  28 year old African [place] man who HAD been  perfectly suppressed on his antiretroviral regimen of Atripla but who has not been seen by me for nearly 1.5 years. He then claimed when I saw him a year prior to last visit that he was taking his his ARV QOD. I checked VL which did not show VF with resistance fortunately.  I thought we had changed him to Tivicay and Descovy but it appears he was put on TRIUMEQ.  He eventually was changed over to [place].  In the interim he was been struggling with a rash which to me is always seemed like eczema on exam.  I have given him triamcinolone in the past but apparently this drug was taken off the ADAP formulary.  Therefore he has not been able to afford it at times and I suspect that is the reason why it "did not work".  Antifungals also did not help with his rash.  He has some concern if it may be related to his antiretrovirals but have a hard time believing that he has had this rash for the last several years due to an antegrade strand transfer inhibitor.  Its appearance very much looks like something with eczema.  Only found out that this drug was off the formulary with ADAP we did find that we could obtain it at [facility] for him for $9 and I am sent a prescription there instead.  He has remained virologically suppressed on his Biktarvy but does need to  renew his HMA P program  Try the the corticosteroid for a week which I do not understand since it seem to be several weeks side seen him. He has not noticed a great deal of improvement scheduled today's visit for possible punch biopsy which performed today in clinic.   Past Medical History:  Diagnosis Date  . Eczema 01/17/2018  . Flexural eczema 04/01/2017  . Headache   . HIV infection (HCC)   . HTN (hypertension) 11/01/2015  . Hypertension   . Rash  04/01/2017  . Rash, skin    abdomen; treated w cream  . Wears glasses     Past Surgical History:  Procedure Laterality Date  . ANAL FISTULOTOMY N/A 02/03/2016   Procedure: ANAL FISTULOTOMY;  Surgeon: Romie Levee, MD;  Location: [facility];  Service: General;  Laterality: N/A;  . COLONOSCOPY    . RECTAL EXAM UNDER ANESTHESIA N/A 02/03/2016   Procedure: ANAL EXAM UNDER ANESTHESIA;  Surgeon: Romie Levee, MD;  Location: [facility];  Service: General;  Laterality: N/A;  . WISDOM TOOTH EXTRACTION      No family history on file.    Social History   Socioeconomic History  . Marital status: Single    Spouse name: Not on file  . Number of children: Not on file  . Years of education: Not on file  . Highest education level: Not on file  Occupational History  . Not on file  Social Needs  . Financial resource strain: Not on file  . Food insecurity:    Worry: Not on file    Inability: Not on file  . Transportation needs:    Medical: Not on file    Non-medical: Not on file  Tobacco Use  . Smoking status: Current Some Day Smoker  Packs/day: 0.10    Types: Cigarettes  . Smokeless tobacco: Never Used  . Tobacco comment: only smokes when drinking  Substance and Sexual Activity  . Alcohol use: Yes    Alcohol/week: 7.0 standard drinks    Types: 7 Standard drinks or equivalent per week    Comment: wine, shots, beer on weekends  . Drug use: No    Frequency: 1.0 times per week    Types: Marijuana    Comment: stopped 3 weeks  . Sexual activity: Yes    Partners: Male    Comment: accepted condoms  Lifestyle  . Physical activity:    Days per week: Not on file    Minutes per session: Not on file  . Stress: Not on file  Relationships  . Social connections:    Talks on phone: Not on file    Gets together: Not on file    Attends religious service: Not on file    Active member of club or organization: Not on file    Attends meetings of clubs or  organizations: Not on file    Relationship status: Not on file  Other Topics Concern  . Not on file  Social History Narrative  . Not on file    No Known Allergies   Current Outpatient Medications:  .  bictegravir-emtricitabine-tenofovir AF (BIKTARVY) 50-200-25 MG TABS tablet, Take 1 tablet by mouth daily., Disp: 30 tablet, Rfl: 11 .  triamcinolone ointment (KENALOG) 0.5 %, Apply 1 application topically 2 (two) times daily., Disp: 15 g, Rfl: 5 .  metroNIDAZOLE (FLAGYL) 500 MG tablet, Take 1 tablet (500 mg total) by mouth 2 (two) times daily., Disp: 14 tablet, Rfl: 0 .  ondansetron (ZOFRAN ODT) 8 MG disintegrating tablet, Take 1 tablet (8 mg total) by mouth every 8 (eight) hours as needed for nausea or vomiting., Disp: 10 tablet, Rfl: 0  Current Facility-Administered Medications:  .  0.9 %  sodium chloride infusion, 500 mL, Intravenous, Continuous, Danis, Starr LakeHenry L III, MD    Review of Systems  Constitutional: Negative for activity change, appetite change, chills, diaphoresis, fatigue, fever and unexpected weight change.  HENT: Negative for congestion, rhinorrhea, sinus pressure, sneezing, sore throat and trouble swallowing.   Eyes: Negative for photophobia and visual disturbance.  Respiratory: Negative for cough, chest tightness, shortness of breath, wheezing and stridor.   Cardiovascular: Negative for chest pain, palpitations and leg swelling.  Gastrointestinal: Negative for abdominal distention, abdominal pain, anal bleeding, blood in stool, constipation, diarrhea, nausea and vomiting.  Genitourinary: Negative for difficulty urinating, dysuria, flank pain and hematuria.  Musculoskeletal: Negative for arthralgias, back pain, gait problem, joint swelling and myalgias.  Skin: Positive for rash. Negative for color change, pallor and wound.  Neurological: Negative for dizziness, tremors, weakness and light-headedness.  Hematological: Negative for adenopathy. Does not bruise/bleed easily.    Psychiatric/Behavioral: Negative for agitation, behavioral problems, confusion, decreased concentration and dysphoric mood.       Objective:   Physical Exam  Constitutional: He is oriented to person, place, and time. He appears well-developed and well-nourished. No distress.  HENT:  Head: Normocephalic and atraumatic.  Mouth/Throat: Oropharynx is clear and moist. No oropharyngeal exudate.  Eyes: Conjunctivae and EOM are normal. No scleral icterus.  Neck: Normal range of motion. Neck supple. No JVD present.  Cardiovascular: Normal rate and regular rhythm.  Pulmonary/Chest: Effort normal. No respiratory distress. He has no wheezes.  Abdominal: He exhibits no distension.  Musculoskeletal:        General: No tenderness  or edema.  Lymphadenopathy:    He has no cervical adenopathy.  Neurological: He is alert and oriented to person, place, and time. He exhibits normal muscle tone. Coordination normal.  Skin: Skin is warm and dry. Rash noted. He is not diaphoretic. No erythema. No pallor.  Psychiatric: His behavior is normal. Judgment and thought content normal. His mood appears anxious. Abnormal recent memory: .25.   Rash 06/04/17:        Today 01/17/2018:         February 14, 2018:       -Still present especially on the abdomen and also in lower leg we have some new lesions     Assessment & Plan:   Persistent rash :  Punch biopsy on the rash today.  Description punch biopsy on lesion on lower leg Dictation: Non-resolving rash  Consent informed consent was obtained with the patient as documented the in the chart.  Risks patient from the procedure including bleeding infection and pain were explained versus benefits which include diagnosing what this rash is what the cause is the patient accepted these and signed informed consent  Description: Area was prepped with chlorhexidine wipes it was anesthetized with topical anesthetic spray and 2% lidocaine that was  infiltrated with 2 mL into the skin with anesthesia achieved.  4 mm punch was obtained and biopsies submitted in formalin  Estimated blood loss: Minimal  Complications none  Not noticed much of a difference yet.  Therefore we will continue the corticosteroids and hopefully is also using topical emollients I will see him back on March 07, 2018.  If the rash is not improving I will perform a punch biopsy at that time and will consider changing to antifungal therapy   HIV: Continue BIKTARVY if we really are convinced that his rash is related to this we can always change him to another single tablet regimen which he would prefer such as Symtuza or potentially Delstrigo or less desired by me [facility]

## 2018-03-11 ENCOUNTER — Other Ambulatory Visit: Payer: Self-pay | Admitting: Infectious Disease

## 2018-03-11 ENCOUNTER — Telehealth: Payer: Self-pay

## 2018-03-11 DIAGNOSIS — R21 Rash and other nonspecific skin eruption: Secondary | ICD-10-CM

## 2018-03-11 NOTE — Telephone Encounter (Signed)
Patient made aware of biopsy result. Patient would like a referral to Dermatology at this time. Routing to Dr. Daiva Eves for Dermatology referral  Valarie Cones, LPN

## 2018-03-11 NOTE — Telephone Encounter (Signed)
Called patient left voicemail Patient can  call and make appointment for orange card application at   [facility]  [address]  [phone]  And   [facility]  Internist  [facility], [address] [place] [employer]   [phone]  Left patient a message to call back. Claris Che also working on Dermatology appointment . Valarie Cones, LPN

## 2018-03-11 NOTE — Telephone Encounter (Signed)
Excellent

## 2018-03-11 NOTE — Progress Notes (Signed)
ermato

## 2018-03-11 NOTE — Telephone Encounter (Signed)
I will put order in. Can someone help  Him with getting orange card or whatever he would need to be seen since he has no insurance?

## 2018-03-11 NOTE — Telephone Encounter (Signed)
-----   Message from Randall Hiss, MD sent at 03/11/2018 10:32 AM EST ----- pts biopsy was inconclusive but could be c/w drug rash. If he cannot see a Dermatologist I can change his regimen though I doubt it will help since he had rash before. He really needs to be seen by Derm. Again if he would like to change regimen we can bring him in and do so though either need to see someone else or wait until I get back to clinic vs make change over "phone" Let me know what he would like to do

## 2018-03-17 NOTE — Telephone Encounter (Signed)
Attempted to call patient x2 and also sent patient message via MyChart to inform of new Dermatology referral and needing an appointment to apply for orange card. LPN unsuccessful in trying to reach patient directly. LPN did not leave voice mail on unidentifiable answering service. Valarie Cones, LPN

## 2018-03-17 NOTE — Telephone Encounter (Signed)
Thanks so much. 

## 2018-05-04 ENCOUNTER — Other Ambulatory Visit: Payer: Self-pay | Admitting: Infectious Disease

## 2018-05-05 ENCOUNTER — Other Ambulatory Visit: Payer: Self-pay

## 2018-05-06 ENCOUNTER — Ambulatory Visit: Payer: Self-pay | Admitting: Internal Medicine

## 2018-08-22 ENCOUNTER — Other Ambulatory Visit: Payer: Self-pay | Admitting: *Deleted

## 2018-08-22 DIAGNOSIS — B20 Human immunodeficiency virus [HIV] disease: Secondary | ICD-10-CM

## 2018-08-25 ENCOUNTER — Ambulatory Visit: Payer: Self-pay

## 2018-08-25 ENCOUNTER — Other Ambulatory Visit: Payer: Self-pay

## 2018-08-25 DIAGNOSIS — B20 Human immunodeficiency virus [HIV] disease: Secondary | ICD-10-CM

## 2018-08-26 ENCOUNTER — Ambulatory Visit: Payer: Self-pay

## 2018-08-26 ENCOUNTER — Other Ambulatory Visit: Payer: Self-pay

## 2018-08-26 LAB — CYTOLOGY, (ORAL, ANAL, URETHRAL) ANCILLARY ONLY
Chlamydia: NEGATIVE
Neisseria Gonorrhea: NEGATIVE

## 2018-08-26 LAB — T-HELPER CELL (CD4) - (RCID CLINIC ONLY)
CD4 % Helper T Cell: 26 % — ABNORMAL LOW (ref 33–65)
CD4 T Cell Abs: 510 /uL (ref 400–1790)

## 2018-08-28 ENCOUNTER — Encounter: Payer: Self-pay | Admitting: Infectious Disease

## 2018-08-29 LAB — COMPLETE METABOLIC PANEL WITH GFR
AG Ratio: 1.4 (calc) (ref 1.0–2.5)
ALT: 12 U/L (ref 9–46)
AST: 16 U/L (ref 10–40)
Albumin: 4.2 g/dL (ref 3.6–5.1)
Alkaline phosphatase (APISO): 50 U/L (ref 36–130)
BUN: 13 mg/dL (ref 7–25)
CO2: 26 mmol/L (ref 20–32)
Calcium: 9.5 mg/dL (ref 8.6–10.3)
Chloride: 106 mmol/L (ref 98–110)
Creat: 1.2 mg/dL (ref 0.60–1.35)
GFR, Est African American: 93 mL/min/{1.73_m2} (ref >=60)
GFR, Est Non African American: 81 mL/min/{1.73_m2} (ref >=60)
Globulin: 2.9 g/dL (calc) (ref 1.9–3.7)
Glucose, Bld: 91 mg/dL (ref 65–99)
Potassium: 3.9 mmol/L (ref 3.5–5.3)
Sodium: 140 mmol/L (ref 135–146)
Total Bilirubin: 0.4 mg/dL (ref 0.2–1.2)
Total Protein: 7.1 g/dL (ref 6.1–8.1)

## 2018-08-29 LAB — CBC WITH DIFFERENTIAL/PLATELET
Absolute Monocytes: 550 cells/uL (ref 200–950)
Basophils Absolute: 9 cells/uL (ref 0–200)
Basophils Relative: 0.2 %
Eosinophils Absolute: 120 cells/uL (ref 15–500)
Eosinophils Relative: 2.8 %
HCT: 43.7 % (ref 38.5–50.0)
Hemoglobin: 14.5 g/dL (ref 13.2–17.1)
Lymphs Abs: 1987 cells/uL (ref 850–3900)
MCH: 28 pg (ref 27.0–33.0)
MCHC: 33.2 g/dL (ref 32.0–36.0)
MCV: 84.5 fL (ref 80.0–100.0)
MPV: 9.8 fL (ref 7.5–12.5)
Monocytes Relative: 12.8 %
Neutro Abs: 1634 cells/uL (ref 1500–7800)
Neutrophils Relative %: 38 %
Platelets: 165 10*3/uL (ref 140–400)
RBC: 5.17 10*6/uL (ref 4.20–5.80)
RDW: 13.9 % (ref 11.0–15.0)
Total Lymphocyte: 46.2 %
WBC: 4.3 10*3/uL (ref 3.8–10.8)

## 2018-08-29 LAB — HIV-1 RNA QUANT-NO REFLEX-BLD
HIV 1 RNA Quant: 21 copies/mL — ABNORMAL HIGH
HIV-1 RNA Quant, Log: 1.32 Log copies/mL — ABNORMAL HIGH

## 2018-09-01 ENCOUNTER — Telehealth: Payer: Self-pay | Admitting: Pharmacy Technician

## 2018-09-01 NOTE — Telephone Encounter (Addendum)
[facility] Patient Advocate Encounter  Completed and sent [employer] Advancing Access application for [place] for this patient who is uninsured..  He applied for HMAP today.  He has been approved for assistance through 09/04/2019 to be used until [facility] is approved..  ID [id number] BIN [id number] PCN [id number] GRP [id number]   Inez Catalina E. Nadara Mustard Gordon Patient [facility] Phone: [phone] Fax:  [phone]

## 2018-09-22 ENCOUNTER — Other Ambulatory Visit: Payer: Self-pay

## 2018-09-22 ENCOUNTER — Encounter: Payer: Self-pay | Admitting: Infectious Disease

## 2018-09-22 ENCOUNTER — Ambulatory Visit (INDEPENDENT_AMBULATORY_CARE_PROVIDER_SITE_OTHER): Payer: Self-pay | Admitting: Infectious Disease

## 2018-09-22 VITALS — BP 126/86 | HR 68 | Temp 97.9°F

## 2018-09-22 DIAGNOSIS — L308 Other specified dermatitis: Secondary | ICD-10-CM

## 2018-09-22 DIAGNOSIS — R21 Rash and other nonspecific skin eruption: Secondary | ICD-10-CM

## 2018-09-22 DIAGNOSIS — B20 Human immunodeficiency virus [HIV] disease: Secondary | ICD-10-CM

## 2018-09-22 MED ORDER — TRIAMCINOLONE ACETONIDE 0.5 % EX OINT: 1.0000 "application " | TOPICAL_OINTMENT | Freq: Two times a day (BID) | CUTANEOUS | 5 refills | Status: DC

## 2018-09-22 MED ORDER — BIKTARVY 50-200-25 MG PO TABS: 1.0000 | ORAL_TABLET | Freq: Every day | ORAL | 11 refills | Status: DC

## 2018-09-22 MED FILL — TRIAMCINOLONE 0.5% OINTMENT: 0.5 | 30 days supply | Qty: 15 | Fill #0

## 2018-09-22 NOTE — Progress Notes (Signed)
Subjective:   Chief complaint: Rash still not much improved   Patient ID: Patrick Reilly, male    DOB: 09/17/1990, 28 y.o.   MRN: [id number]  HPI  28 year old African [place] man who HAD been  perfectly suppressed on his antiretroviral regimen of Atripla but who has not been seen by me for nearly 1.5 years. He then claimed when I saw him a year prior to last visit that he was taking his his ARV QOD. I checked VL which did not show VF with resistance fortunately.  I thought we had changed him to Tivicay and Descovy but it appears he was put on TRIUMEQ.  He eventually was changed over to [place].  In the interim he was been struggling with a rash which to me is always seemed like eczema on exam.  I have given him triamcinolone in the past but apparently this drug was taken off the ADAP formulary.  Therefore he has not been able to afford it at times and I suspect that is the reason why it "did not work".  Antifungals also did not help with his rash.  He has some concern if it may be related to his antiretrovirals but have a hard time believing that he has had this rash for the last several years due to an antegrade strand transfer inhibitor.  Its appearance very much looks like something with eczema.  Only found out that this drug was off the formulary with ADAP we did find that [facility] could obtain it [facility] him for $9 and I am sent a prescription there instead.  He has remained virologically suppressed on his Biktarvy but does need to  renew his HMA P program  I did a punch biopsy of his rash in the biopsy was inconclusive but could have been consistent with a drug rash.  In talking him again today he says the rash was present back when he was on pleura.  So I do not really think it does correlate to his antiretrovirals.  It does according to him respond to steroids now.  He is interested in seeing a dermatologist but needs to get insurance which he has intention of doing so if he can  find employment.     Past Medical History:  Diagnosis Date  . Eczema 01/17/2018  . Flexural eczema 04/01/2017  . Headache   . HIV infection (HCC)   . HTN (hypertension) 11/01/2015  . Hypertension   . Rash 04/01/2017  . Rash, skin    abdomen; treated w cream  . Wears glasses     Past Surgical History:  Procedure Laterality Date  . ANAL FISTULOTOMY N/A 02/03/2016   Procedure: ANAL FISTULOTOMY;  Surgeon: Romie LeveeAlicia Thomas, MD;  [facility];  Service: General;  Laterality: N/A;  . COLONOSCOPY    . RECTAL EXAM UNDER ANESTHESIA N/A 02/03/2016   Procedure: ANAL EXAM UNDER ANESTHESIA;  Surgeon: Romie LeveeAlicia Thomas, MD;  [facility];  Service: General;  Laterality: N/A;  . WISDOM TOOTH EXTRACTION      No family history on file.    Social History   Socioeconomic History  . Marital status: Single    Spouse name: Not on file  . Number of children: Not on file  . Years of education: Not on file  . Highest education level: Not on file  Occupational History  . Not on file  Social Needs  . Financial resource strain: Not on file  .  Food insecurity    Worry: Not on file    Inability: Not on file  . Transportation needs    Medical: Not on file    Non-medical: Not on file  Tobacco Use  . Smoking status: Current Some Day Smoker    Packs/day: 0.10    Types: Cigarettes  . Smokeless tobacco: Never Used  . Tobacco comment: only smokes when drinking  Substance and Sexual Activity  . Alcohol use: Yes    Alcohol/week: 7.0 standard drinks    Types: 7 Standard drinks or equivalent per week    Comment: wine, shots, beer on weekends  . Drug use: No    Frequency: 1.0 times per week    Types: Marijuana    Comment: stopped 3 weeks  . Sexual activity: Yes    Partners: Male    Comment: accepted condoms  Lifestyle  . Physical activity    Days per week: Not on file    Minutes per session: Not on file  . Stress: Not on file  Relationships  .  Social Herbalist on phone: Not on file    Gets together: Not on file    Attends religious service: Not on file    Active member of club or organization: Not on file    Attends meetings of clubs or organizations: Not on file    Relationship status: Not on file  Other Topics Concern  . Not on file  Social History Narrative  . Not on file    No Known Allergies   Current Outpatient Medications:  .  bictegravir-emtricitabine-tenofovir AF (BIKTARVY) 50-200-25 MG TABS tablet, Take 1 tablet by mouth daily., Disp: 30 tablet, Rfl: 11 .  triamcinolone ointment (KENALOG) 0.5 %, Apply 1 application topically 2 (two) times daily., Disp: 15 g, Rfl: 5 .  metroNIDAZOLE (FLAGYL) 500 MG tablet, Take 1 tablet (500 mg total) by mouth 2 (two) times daily. (Patient not taking: Reported on 09/22/2018), Disp: 14 tablet, Rfl: 0 .  ondansetron (ZOFRAN ODT) 8 MG disintegrating tablet, Take 1 tablet (8 mg total) by mouth every 8 (eight) hours as needed for nausea or vomiting. (Patient not taking: Reported on 09/22/2018), Disp: 10 tablet, Rfl: 0  Current Facility-Administered Medications:  .  0.9 %  sodium chloride infusion, 500 mL, Intravenous, Continuous, Danis, Estill Cotta III, MD    Review of Systems  Constitutional: Negative for activity change, appetite change, chills, diaphoresis, fatigue, fever and unexpected weight change.  HENT: Negative for congestion, rhinorrhea, sinus pressure, sneezing, sore throat and trouble swallowing.   Eyes: Negative for photophobia and visual disturbance.  Respiratory: Negative for cough, chest tightness, shortness of breath, wheezing and stridor.   Cardiovascular: Negative for chest pain, palpitations and leg swelling.  Gastrointestinal: Negative for abdominal distention, abdominal pain, anal bleeding, blood in stool, constipation, diarrhea, nausea and vomiting.  Genitourinary: Negative for difficulty urinating, dysuria, flank pain and hematuria.  Musculoskeletal:  Negative for arthralgias, back pain, gait problem, joint swelling and myalgias.  Skin: Positive for rash. Negative for color change, pallor and wound.  Neurological: Negative for dizziness, tremors, weakness and light-headedness.  Hematological: Negative for adenopathy. Does not bruise/bleed easily.  Psychiatric/Behavioral: Negative for agitation, behavioral problems, confusion, decreased concentration and dysphoric mood.       Objective:   Physical Exam  Constitutional: He is oriented to person, place, and time. He appears well-developed and well-nourished. No distress.  HENT:  Head: Normocephalic and atraumatic.  Mouth/Throat: Oropharynx is clear and moist. No  oropharyngeal exudate.  Eyes: Conjunctivae and EOM are normal. No scleral icterus.  Neck: Normal range of motion. Neck supple. No JVD present.  Cardiovascular: Normal rate and regular rhythm.  Pulmonary/Chest: Effort normal. No respiratory distress. He has no wheezes.  Abdominal: He exhibits no distension.  Musculoskeletal:        General: No tenderness or edema.  Lymphadenopathy:    He has no cervical adenopathy.  Neurological: He is alert and oriented to person, place, and time. He exhibits normal muscle tone. Coordination normal.  Skin: Skin is warm and dry. Rash noted. He is not diaphoretic. No erythema. No pallor.  Psychiatric: He has a normal mood and affect. His behavior is normal. Judgment and thought content normal. His mood appears not anxious. Abnormal recent memory: .25.         Assessment & Plan:    HIV: Continue BIKTARVY   Rash: Continue steroids and hopefully will get insurance will be able to get plugged in with a dermatologist

## 2018-12-09 ENCOUNTER — Telehealth: Payer: Self-pay

## 2018-12-09 NOTE — Telephone Encounter (Signed)
Patient walked into clinic today requesting a prescription for prednisone. States that he has had swelling on left side of face due to congestion for the last week and a half. States he has tried over the counter decongestants, but has failed to see improvement. Patient is concerned that the swelling will cause fever and would like to be proactive before this happens. Does state that the congestion and swelling is making his teeth hurt. Advised patient to follow up with urgent care regarding this issue.  Patient would like to have MD send in prescription to avoid urgent care bill. Will forward message MD to advise. [place]

## 2018-12-10 ENCOUNTER — Ambulatory Visit (INDEPENDENT_AMBULATORY_CARE_PROVIDER_SITE_OTHER): Payer: Self-pay | Admitting: Infectious Disease

## 2018-12-10 ENCOUNTER — Other Ambulatory Visit: Payer: Self-pay

## 2018-12-10 ENCOUNTER — Encounter: Payer: Self-pay | Admitting: Infectious Disease

## 2018-12-10 VITALS — BP 156/96 | HR 81 | Temp 98.2°F | Wt 171.0 lb

## 2018-12-10 DIAGNOSIS — B9689 Other specified bacterial agents as the cause of diseases classified elsewhere: Secondary | ICD-10-CM

## 2018-12-10 DIAGNOSIS — Z23 Encounter for immunization: Secondary | ICD-10-CM

## 2018-12-10 DIAGNOSIS — B20 Human immunodeficiency virus [HIV] disease: Secondary | ICD-10-CM

## 2018-12-10 DIAGNOSIS — J329 Chronic sinusitis, unspecified: Secondary | ICD-10-CM | POA: Insufficient documentation

## 2018-12-10 HISTORY — DX: Other specified bacterial agents as the cause of diseases classified elsewhere: J32.9

## 2018-12-10 HISTORY — DX: Other specified bacterial agents as the cause of diseases classified elsewhere: B96.89

## 2018-12-10 MED ORDER — AMOXICILLIN-POT CLAVULANATE 875-125 MG PO TABS: 1.0000 | ORAL_TABLET | Freq: Two times a day (BID) | ORAL | 1 refills | Status: DC

## 2018-12-10 NOTE — Progress Notes (Signed)
Subjective:  Chief complaint: Swelling of his face and facial pain  Patient ID: Patrick Reilly, male    DOB: 06/09/1990, 28 y.o.   MRN: [id number]  HPI  Patrick Reilly is a 28 year old African-American man living with HIV that is well controlled on Biktarvy.  He does suffer from seasonal allergies and had what he thought was a bout of seasonal allergies that he was treating with over-the-counter medications but now has had progression with worsening of his sinus congestion and now swelling of his face.  He called the clinic asking for corticosteroid believing this was due to his seasonal allergies.  I am concerned rather for bacterial sinusitis and will prescribe him antibiotics in the form of Augmentin.    Past Medical History:  Diagnosis Date  . Bacterial sinusitis 12/10/2018  . Eczema 01/17/2018  . Flexural eczema 04/01/2017  . Headache   . HIV infection (HCC)   . HTN (hypertension) 11/01/2015  . Hypertension   . Rash 04/01/2017  . Rash, skin    abdomen; treated w cream  . Wears glasses     Past Surgical History:  Procedure Laterality Date  . ANAL FISTULOTOMY N/A 02/03/2016   Procedure: ANAL FISTULOTOMY;  Surgeon: Romie LeveeAlicia Thomas, MD;  [facility] [place];  Service: General;  Laterality: N/A;  . COLONOSCOPY    . RECTAL EXAM UNDER ANESTHESIA N/A 02/03/2016   Procedure: ANAL EXAM UNDER ANESTHESIA;  Surgeon: Romie LeveeAlicia Thomas, MD;  [facility] [place];  Service: General;  Laterality: N/A;  . WISDOM TOOTH EXTRACTION      No family history on file.    Social History   Socioeconomic History  . Marital status: Single    Spouse name: Not on file  . Number of children: Not on file  . Years of education: Not on file  . Highest education level: Not on file  Occupational History  . Not on file  Social Needs  . Financial resource strain: Not on file  . Food insecurity    Worry: Not on file    Inability: Not on file  . Transportation needs    Medical: Not  on file    Non-medical: Not on file  Tobacco Use  . Smoking status: Current Some Day Smoker    Packs/day: 0.10    Types: Cigarettes  . Smokeless tobacco: Never Used  . Tobacco comment: only smokes when drinking  Substance and Sexual Activity  . Alcohol use: Yes    Alcohol/week: 7.0 standard drinks    Types: 7 Standard drinks or equivalent per week    Comment: wine, shots, beer on weekends  . Drug use: No    Frequency: 1.0 times per week    Types: Marijuana    Comment: stopped 3 weeks  . Sexual activity: Yes    Partners: Male    Comment: accepted condoms  Lifestyle  . Physical activity    Days per week: Not on file    Minutes per session: Not on file  . Stress: Not on file  Relationships  . Social Musicianconnections    Talks on phone: Not on file    Gets together: Not on file    Attends religious service: Not on file    Active member of club or organization: Not on file    Attends meetings of clubs or organizations: Not on file    Relationship status: Not on file  Other Topics Concern  . Not on file  Social History Narrative  . Not  on file    No Known Allergies   Current Outpatient Medications:  .  amoxicillin-clavulanate (AUGMENTIN) 875-125 MG tablet, Take 1 tablet by mouth 2 (two) times daily., Disp: 28 tablet, Rfl: 1 .  bictegravir-emtricitabine-tenofovir AF (BIKTARVY) 50-200-25 MG TABS tablet, Take 1 tablet by mouth daily., Disp: 30 tablet, Rfl: 11 .  metroNIDAZOLE (FLAGYL) 500 MG tablet, Take 1 tablet (500 mg total) by mouth 2 (two) times daily. (Patient not taking: Reported on 09/22/2018), Disp: 14 tablet, Rfl: 0 .  ondansetron (ZOFRAN ODT) 8 MG disintegrating tablet, Take 1 tablet (8 mg total) by mouth every 8 (eight) hours as needed for nausea or vomiting. (Patient not taking: Reported on 09/22/2018), Disp: 10 tablet, Rfl: 0 .  triamcinolone ointment (KENALOG) 0.5 %, Apply 1 application topically 2 (two) times daily., Disp: 15 g, Rfl: 5  Current Facility-Administered  Medications:  .  0.9 %  sodium chloride infusion, 500 mL, Intravenous, Continuous, Danis, Starr Lake III, MD   Review of Systems  Constitutional: Negative for chills and fever.  HENT: Positive for congestion, facial swelling, sinus pressure, sinus pain and sneezing. Negative for hearing loss, mouth sores and sore throat.   Eyes: Negative for photophobia.  Respiratory: Negative for cough, shortness of breath and wheezing.   Cardiovascular: Negative for chest pain, palpitations and leg swelling.  Gastrointestinal: Negative for abdominal pain, blood in stool, constipation, diarrhea, nausea and vomiting.  Genitourinary: Negative for dysuria, flank pain and hematuria.  Musculoskeletal: Negative for back pain and myalgias.  Skin: Negative for rash.  Neurological: Negative for dizziness, weakness and headaches.  Hematological: Does not bruise/bleed easily.  Psychiatric/Behavioral: Negative for agitation, confusion and suicidal ideas.       Objective:   Physical Exam Constitutional:      General: He is not in acute distress.    Appearance: Normal appearance. He is well-developed. He is not ill-appearing or diaphoretic.  HENT:     Head: Normocephalic and atraumatic.      Right Ear: Hearing and external ear normal.     Left Ear: Hearing and external ear normal.     Nose: No nasal deformity or rhinorrhea.  Eyes:     General: No scleral icterus.    Extraocular Movements: Extraocular movements intact.     Conjunctiva/sclera: Conjunctivae normal.     Right eye: Right conjunctiva is not injected.     Left eye: Left conjunctiva is not injected.     Pupils: Pupils are equal, round, and reactive to light.  Neck:     Musculoskeletal: Normal range of motion and neck supple.     Vascular: No JVD.  Cardiovascular:     Rate and Rhythm: Normal rate and regular rhythm.     Heart sounds: S1 normal and S2 normal.  Pulmonary:     Effort: Pulmonary effort is normal. No respiratory distress.     Breath  sounds: No wheezing.  Abdominal:     General: There is no distension.     Palpations: Abdomen is soft.  Musculoskeletal: Normal range of motion.     Right shoulder: Normal.     Left shoulder: Normal.     Right hip: Normal.     Left hip: Normal.     Right knee: Normal.     Left knee: Normal.  Lymphadenopathy:     Head:     Right side of head: No submandibular, preauricular or posterior auricular adenopathy.     Left side of head: No submandibular, preauricular or posterior auricular  adenopathy.     Cervical: No cervical adenopathy.     Right cervical: No superficial or deep cervical adenopathy.    Left cervical: No superficial or deep cervical adenopathy.  Skin:    General: Skin is warm and dry.     Coloration: Skin is not jaundiced or pale.     Findings: No abrasion, bruising, ecchymosis, erythema, lesion or rash.     Nails: There is no clubbing.   Neurological:     General: No focal deficit present.     Mental Status: He is alert and oriented to person, place, and time.     Sensory: No sensory deficit.     Coordination: Coordination normal.     Gait: Gait normal.  Psychiatric:        Attention and Perception: He is attentive.        Mood and Affect: Mood normal.        Speech: Speech normal.        Behavior: Behavior normal. Behavior is cooperative.        Thought Content: Thought content normal.        Judgment: Judgment normal.    Facial swelling December 10, 2018:           Assessment & Plan:  Bacterial sinusitis: Prescribed Augmentin for 2 weeks.  HIV disease well-controlled on Biktarvy he will return to clinic in the January time.  To renew his HIV medication assistance program.  We will give him a flu shot today

## 2018-12-10 NOTE — Telephone Encounter (Signed)
I would not give prednisone for facial swelling without knowing the cause

## 2018-12-10 NOTE — Telephone Encounter (Signed)
Called patient to follow up on conerns from yesterday. Patient states that he is still having swelling and pain on his teeth. Did not follow up with Urgent Care ; will have patient come into office today 11:15 to Dr. Tommy Medal. Aundria Rud, CMA

## 2018-12-12 ENCOUNTER — Ambulatory Visit: Payer: Self-pay

## 2019-02-04 NOTE — Addendum Note (Signed)
Addended by: Enzio Buchler D on: 02/04/2019 03:38 PM   Modules accepted: Orders  

## 2019-02-09 ENCOUNTER — Other Ambulatory Visit: Payer: Self-pay

## 2019-02-20 ENCOUNTER — Telehealth: Payer: Self-pay

## 2019-02-20 NOTE — Telephone Encounter (Signed)
COVID-19 Pre-Screening Questions:02/20/19   Do you currently have a fever (>100 F), chills or unexplained body aches? NO   Are you currently experiencing new cough, shortness of breath, sore throat, runny nose?NO  .  Have you recently travelled outside the state of [place] in the last 14 days? NO  .  Have you been in contact with someone that is currently pending confirmation of Covid19 testing or has been confirmed to have the Covid19 virus? NO   **If the patient answers NO to ALL questions -  advise the patient to please call the clinic before coming to the office should any symptoms develop.     

## 2019-02-23 ENCOUNTER — Ambulatory Visit (INDEPENDENT_AMBULATORY_CARE_PROVIDER_SITE_OTHER): Payer: Self-pay | Admitting: Infectious Disease

## 2019-02-23 ENCOUNTER — Encounter: Payer: Self-pay | Admitting: Infectious Disease

## 2019-02-23 ENCOUNTER — Other Ambulatory Visit: Payer: Self-pay

## 2019-02-23 VITALS — BP 153/90 | HR 89 | Temp 97.8°F | Ht 66.0 in | Wt 171.0 lb

## 2019-02-23 DIAGNOSIS — B9689 Other specified bacterial agents as the cause of diseases classified elsewhere: Secondary | ICD-10-CM

## 2019-02-23 DIAGNOSIS — J329 Chronic sinusitis, unspecified: Secondary | ICD-10-CM

## 2019-02-23 DIAGNOSIS — Z23 Encounter for immunization: Secondary | ICD-10-CM

## 2019-02-23 DIAGNOSIS — A630 Anogenital (venereal) warts: Secondary | ICD-10-CM

## 2019-02-23 DIAGNOSIS — I10 Essential (primary) hypertension: Secondary | ICD-10-CM

## 2019-02-23 DIAGNOSIS — L308 Other specified dermatitis: Secondary | ICD-10-CM

## 2019-02-23 DIAGNOSIS — B20 Human immunodeficiency virus [HIV] disease: Secondary | ICD-10-CM

## 2019-02-23 MED ORDER — TRIAMCINOLONE ACETONIDE 0.5 % EX OINT: 1.0000 "application " | TOPICAL_OINTMENT | Freq: Two times a day (BID) | CUTANEOUS | 5 refills | Status: AC

## 2019-02-23 MED ORDER — BIKTARVY 50-200-25 MG PO TABS: 1.0000 | ORAL_TABLET | Freq: Every day | ORAL | 11 refills | Status: AC

## 2019-02-23 MED FILL — TRIAMCINOLONE 0.5% OINTMENT: 0.5 | 7 days supply | Qty: 15 | Fill #0

## 2019-02-23 NOTE — Progress Notes (Signed)
Subjective:   Chief complaint:    Patient ID: Patrick Reilly, male    DOB: [birth year], 29 y.o.   MRN: [id number]  HPI  29 year old African [place] man who HAD been  perfectly suppressed on his antiretroviral regimen of Atripla but who has not been seen by me for nearly 1.5 years. He then claimed when I saw him a year prior to last visit that he was taking his his ARV QOD. I checked VL which did not show VF with resistance fortunately.  I thought we had changed him to [place] but it appears he was put on TRIUMEQ.  He eventually was changed over to [place].   Is done quite well and this remained suppressed.  He is due for labs having last had labs in July.  He asked for renewed prescription of his corticosteroid which he wants to be sent to [facility].  I did see him in November as well when he had bacterial sinusitis which responded well to Augmentin.      Past Medical History:  Diagnosis Date  . Bacterial sinusitis 12/10/2018  . Eczema 01/17/2018  . Flexural eczema 04/01/2017  . Headache   . HIV infection ([place])   . HTN (hypertension) 11/01/2015  . Hypertension   . Rash 04/01/2017  . Rash, skin    abdomen; treated w cream  . Wears glasses     Past Surgical History:  Procedure Laterality Date  . ANAL FISTULOTOMY N/A 02/03/2016   Procedure: ANAL FISTULOTOMY;  Surgeon: Leighton Ruff, MD;  [facility]: [facility];  Service: General;  Laterality: N/A;  . COLONOSCOPY    . RECTAL EXAM UNDER ANESTHESIA N/A 02/03/2016   Procedure: ANAL EXAM UNDER ANESTHESIA;  Surgeon: Leighton Ruff, MD;  Location: [place];  Service: General;  Laterality: N/A;  . WISDOM TOOTH EXTRACTION      No family history on file.    Social History   Socioeconomic History  . Marital status: Single    Spouse name: Not on file  . Number of children: Not on file  . Years of education: Not on file  . Highest education level: Not on file  Occupational History   . Not on file  Tobacco Use  . Smoking status: Current Some Day Smoker    Packs/day: 0.10    Types: Cigarettes  . Smokeless tobacco: Never Used  . Tobacco comment: only smokes when drinking  Substance and Sexual Activity  . Alcohol use: Yes    Alcohol/week: 7.0 standard drinks    Types: 7 Standard drinks or equivalent per week    Comment: wine, shots, beer on weekends  . Drug use: No    Frequency: 1.0 times per week    Types: Marijuana    Comment: stopped 3 weeks  . Sexual activity: Yes    Partners: Male    Comment: accepted condoms  Other Topics Concern  . Not on file  Social History Narrative  . Not on file   Social Determinants of Health   Financial Resource Strain:   . Difficulty of Paying Living Expenses: Not on file  Food Insecurity:   . Worried About Charity fundraiser in the Last Year: Not on file  . Ran Out of Food in the Last Year: Not on file  Transportation Needs:   . Lack of Transportation (Medical): Not on file  . Lack of Transportation (Non-Medical): Not on file  Physical Activity:   . Days of  Exercise per Week: Not on file  . Minutes of Exercise per Session: Not on file  Stress:   . Feeling of Stress : Not on file  Social Connections:   . Frequency of Communication with Friends and Family: Not on file  . Frequency of Social Gatherings with Friends and Family: Not on file  . Attends Religious Services: Not on file  . Active Member of Clubs or Organizations: Not on file  . Attends Banker Meetings: Not on file  . Marital Status: Not on file    No Known Allergies   Current Outpatient Medications:  .  amoxicillin-clavulanate (AUGMENTIN) 875-125 MG tablet, Take 1 tablet by mouth 2 (two) times daily., Disp: 28 tablet, Rfl: 1 .  bictegravir-emtricitabine-tenofovir AF (BIKTARVY) 50-200-25 MG TABS tablet, Take 1 tablet by mouth daily., Disp: 30 tablet, Rfl: 11 .  metroNIDAZOLE (FLAGYL) 500 MG tablet, Take 1 tablet (500 mg total) by mouth 2  (two) times daily. (Patient not taking: Reported on 09/22/2018), Disp: 14 tablet, Rfl: 0 .  ondansetron (ZOFRAN ODT) 8 MG disintegrating tablet, Take 1 tablet (8 mg total) by mouth every 8 (eight) hours as needed for nausea or vomiting. (Patient not taking: Reported on 09/22/2018), Disp: 10 tablet, Rfl: 0 .  triamcinolone ointment (KENALOG) 0.5 %, Apply 1 application topically 2 (two) times daily., Disp: 15 g, Rfl: 5  Current Facility-Administered Medications:  .  0.9 %  sodium chloride infusion, 500 mL, Intravenous, Continuous, Danis, Starr Lake III, MD    Review of Systems  Constitutional: Negative for activity change, appetite change, chills, diaphoresis, fatigue, fever and unexpected weight change.  HENT: Negative for congestion, rhinorrhea, sinus pressure, sneezing, sore throat and trouble swallowing.   Eyes: Negative for photophobia and visual disturbance.  Respiratory: Negative for cough, chest tightness, shortness of breath, wheezing and stridor.   Cardiovascular: Negative for chest pain, palpitations and leg swelling.  Gastrointestinal: Negative for abdominal distention, abdominal pain, anal bleeding, blood in stool, constipation, diarrhea, nausea and vomiting.  Genitourinary: Negative for difficulty urinating, dysuria, flank pain and hematuria.  Musculoskeletal: Negative for arthralgias, back pain, gait problem, joint swelling and myalgias.  Skin: Positive for rash. Negative for color change, pallor and wound.  Neurological: Negative for dizziness, tremors, weakness and light-headedness.  Hematological: Negative for adenopathy. Does not bruise/bleed easily.  Psychiatric/Behavioral: Negative for agitation, behavioral problems, confusion, decreased concentration, dysphoric mood and hallucinations. The patient is not hyperactive.        Objective:   Physical Exam  Constitutional: He is oriented to person, place, and time. He appears well-developed and well-nourished. No distress.  HENT:   Head: Normocephalic and atraumatic.  Mouth/Throat: Oropharynx is clear and moist. No oropharyngeal exudate.  Eyes: Conjunctivae and EOM are normal. No scleral icterus.  Neck: No JVD present.  Cardiovascular: Normal rate and regular rhythm.  Pulmonary/Chest: Effort normal. No respiratory distress. He has no wheezes.  Abdominal: He exhibits no distension.  Musculoskeletal:        General: No tenderness or edema.     Cervical back: Normal range of motion and neck supple.  Lymphadenopathy:    He has no cervical adenopathy.  Neurological: He is alert and oriented to person, place, and time. He exhibits normal muscle tone. Coordination normal.  Skin: Skin is warm and dry. He is not diaphoretic. No erythema. No pallor.  Psychiatric: He has a normal mood and affect. His behavior is normal. Judgment and thought content normal. His mood appears not anxious. Abnormal recent memory: .  25.         Assessment & Plan:    HIV: Continue BIKTARVY get labs today renew HIV medication assistance program and see him back in July  Smoker encouraged him to stop smoking.  White coat hypertension: Blood pressure up after smoking and some anxiety about being in clinic.  Need for pneumonia vaccination give Prevnar 13

## 2019-02-24 ENCOUNTER — Ambulatory Visit: Payer: Self-pay

## 2019-02-24 LAB — T-HELPER CELL (CD4) - (RCID CLINIC ONLY)
CD4 % Helper T Cell: 27 % — ABNORMAL LOW (ref 33–65)
CD4 T Cell Abs: 577 /uL (ref 400–1790)

## 2019-02-26 LAB — CBC WITH DIFFERENTIAL/PLATELET
Absolute Monocytes: 467 cells/uL (ref 200–950)
Basophils Absolute: 19 cells/uL (ref 0–200)
Basophils Relative: 0.5 %
Eosinophils Absolute: 160 cells/uL (ref 15–500)
Eosinophils Relative: 4.2 %
HCT: 41.2 % (ref 38.5–50.0)
Hemoglobin: 13.8 g/dL (ref 13.2–17.1)
Lymphs Abs: 2044 cells/uL (ref 850–3900)
MCH: 28.1 pg (ref 27.0–33.0)
MCHC: 33.5 g/dL (ref 32.0–36.0)
MCV: 83.9 fL (ref 80.0–100.0)
MPV: 9.7 fL (ref 7.5–12.5)
Monocytes Relative: 12.3 %
Neutro Abs: 1110 cells/uL — ABNORMAL LOW (ref 1500–7800)
Neutrophils Relative %: 29.2 %
Platelets: 169 10*3/uL (ref 140–400)
RBC: 4.91 10*6/uL (ref 4.20–5.80)
RDW: 14.1 % (ref 11.0–15.0)
Total Lymphocyte: 53.8 %
WBC: 3.8 10*3/uL (ref 3.8–10.8)

## 2019-02-26 LAB — COMPLETE METABOLIC PANEL WITH GFR
AG Ratio: 1.4 (calc) (ref 1.0–2.5)
ALT: 12 U/L (ref 9–46)
AST: 16 U/L (ref 10–40)
Albumin: 4.2 g/dL (ref 3.6–5.1)
Alkaline phosphatase (APISO): 55 U/L (ref 36–130)
BUN: 18 mg/dL (ref 7–25)
CO2: 26 mmol/L (ref 20–32)
Calcium: 9.7 mg/dL (ref 8.6–10.3)
Chloride: 105 mmol/L (ref 98–110)
Creat: 1.21 mg/dL (ref 0.60–1.35)
GFR, Est African American: 93 mL/min/{1.73_m2} (ref >=60)
GFR, Est Non African American: 80 mL/min/{1.73_m2} (ref >=60)
Globulin: 3.1 g/dL (calc) (ref 1.9–3.7)
Glucose, Bld: 82 mg/dL (ref 65–99)
Potassium: 4.1 mmol/L (ref 3.5–5.3)
Sodium: 140 mmol/L (ref 135–146)
Total Bilirubin: 0.4 mg/dL (ref 0.2–1.2)
Total Protein: 7.3 g/dL (ref 6.1–8.1)

## 2019-02-26 LAB — RPR: RPR Ser Ql: NONREACTIVE

## 2019-02-26 LAB — HIV-1 RNA QUANT-NO REFLEX-BLD
HIV 1 RNA Quant: <20 copies/mL — AB
HIV-1 RNA Quant, Log: <1.3 Log copies/mL — AB

## 2019-04-07 IMAGING — CT CT ABD-PELV W/ CM
2 of 4 series · 17 of 46 positions shown, 19 images · IV contrast (ISOVUE)
Comparison: December 20, 2015

CLINICAL DATA: Abdomen pain for 14 hours

EXAM:
CT ABDOMEN AND PELVIS WITH CONTRAST
TECHNIQUE: Multidetector CT imaging of the abdomen and pelvis was performed
using the standard protocol following bolus administration of
intravenous contrast.
CONTRAST:  100mL HW0VTI-IVV IOPAMIDOL (HW0VTI-IVV) INJECTION 61%

[Series 2: axial st · axial · 0.68mm/px · z∈[+1200,+1575]mm · 14 of 87 slices shown, 16 images]
[im 6/87  soft-tissue]
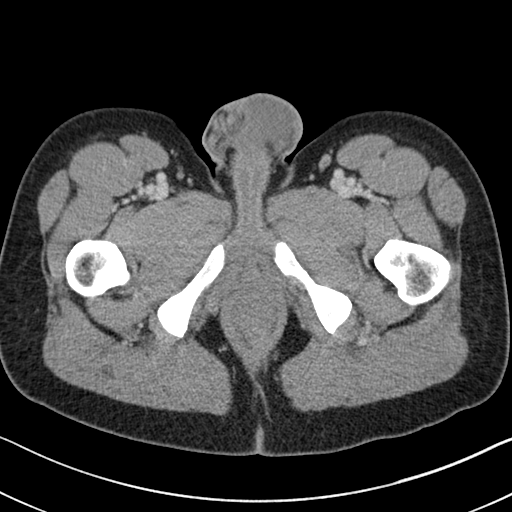
[im 6/87  bone]
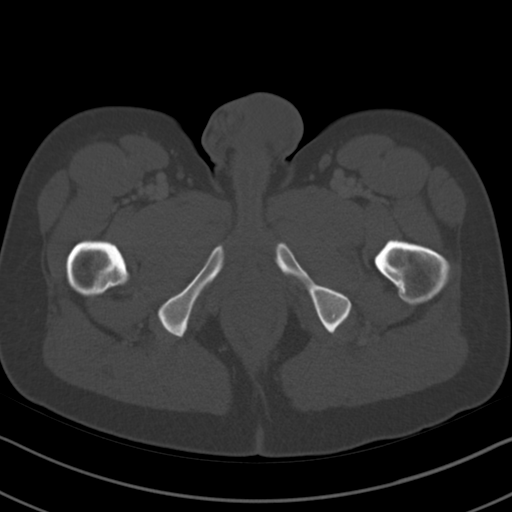
[im 11/87  soft-tissue]
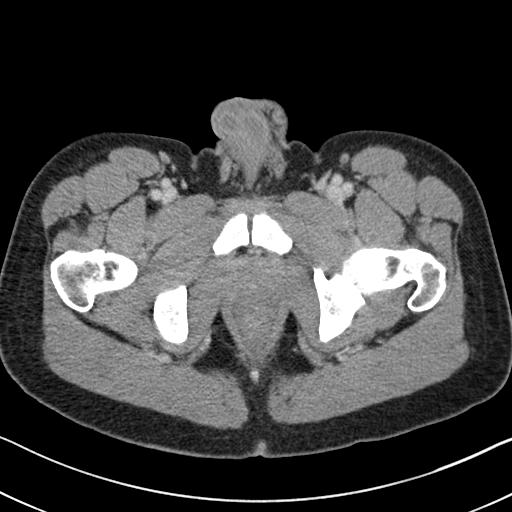
[im 16/87  soft-tissue]
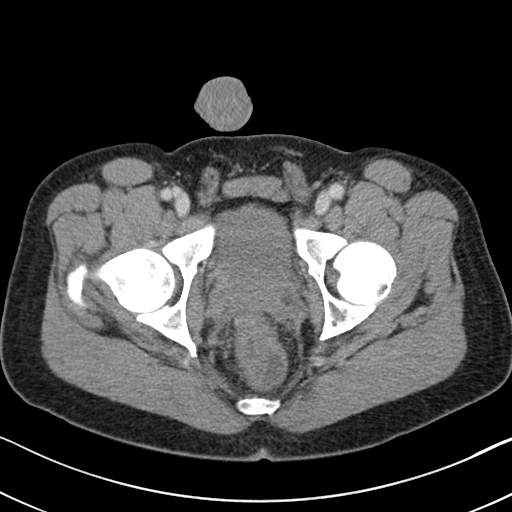
[im 26/87  soft-tissue]
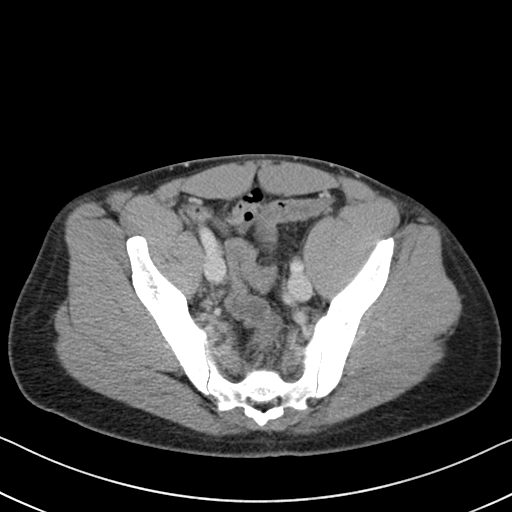
[im 31/87  soft-tissue]
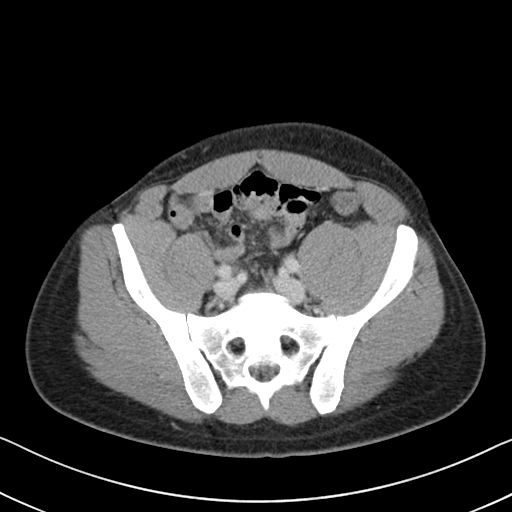
[im 36/87  soft-tissue]
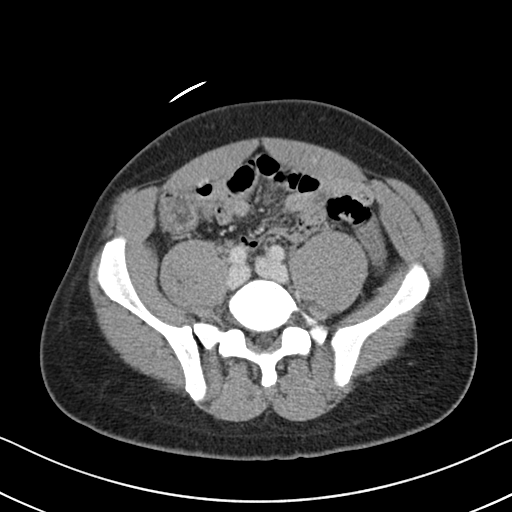
[im 41/87  soft-tissue]
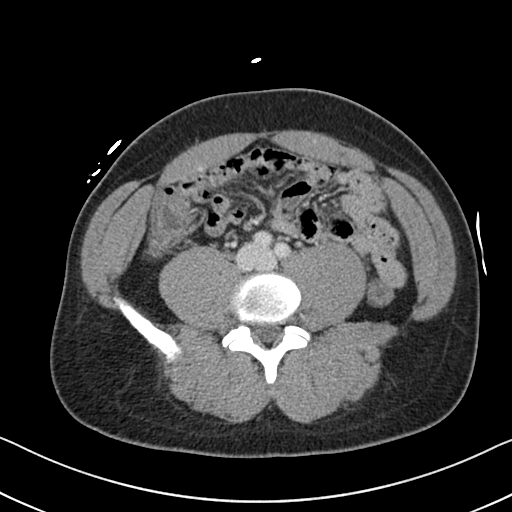
[im 46/87  soft-tissue]
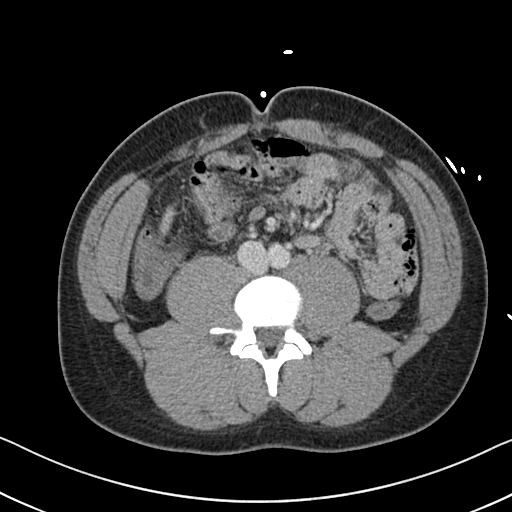
[im 51/87  soft-tissue]
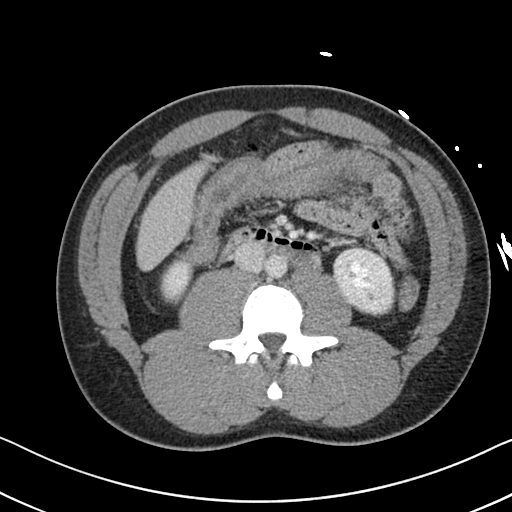
[im 51/87  bone]
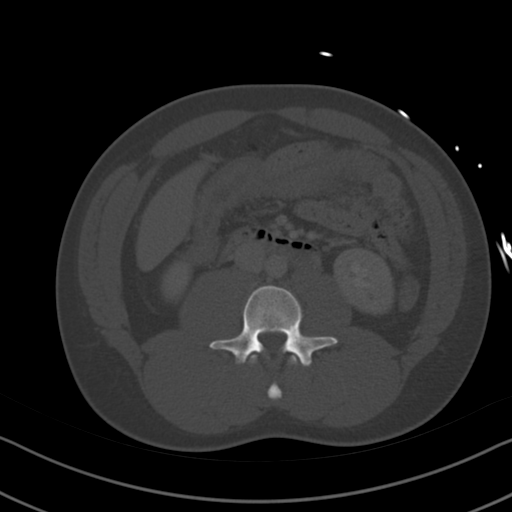
[im 56/87  soft-tissue]
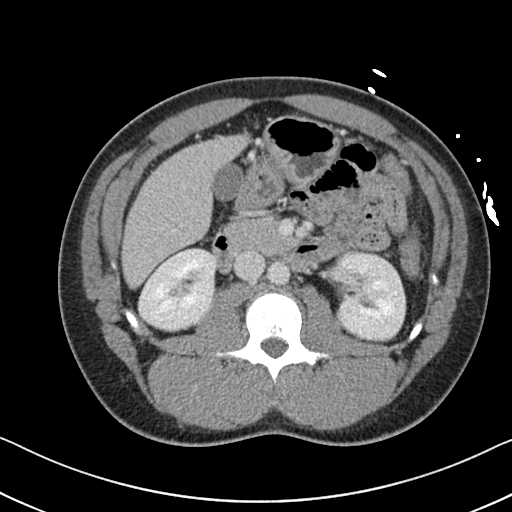
[im 66/87  soft-tissue]
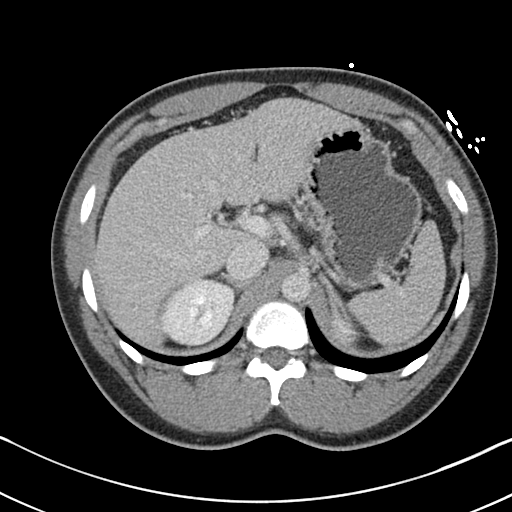
[im 71/87  soft-tissue]
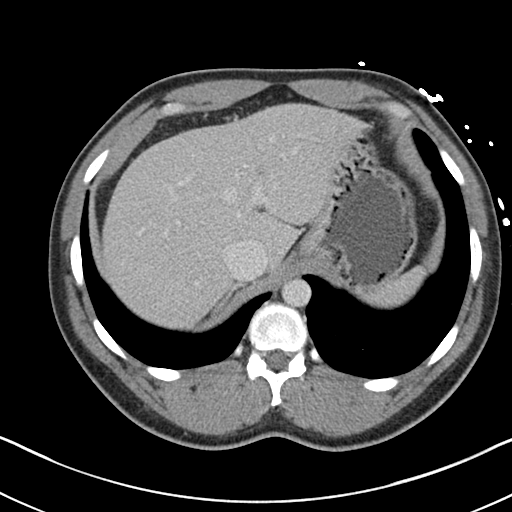
[im 76/87  soft-tissue]
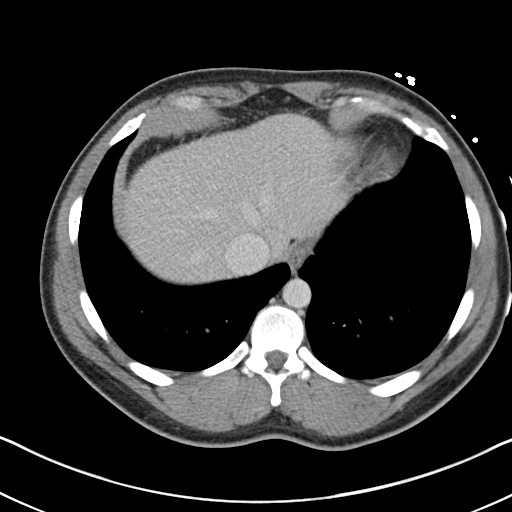
[im 81/87  soft-tissue]
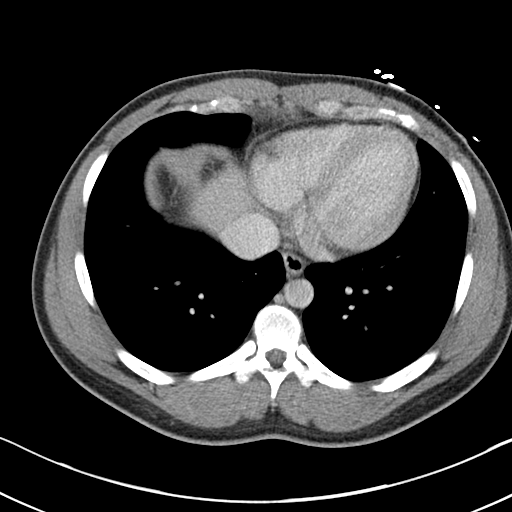

[Series 4: coronal st · coronal · 0.84mm/px · 3 of 81 slices shown]
[im 27/81  soft-tissue]
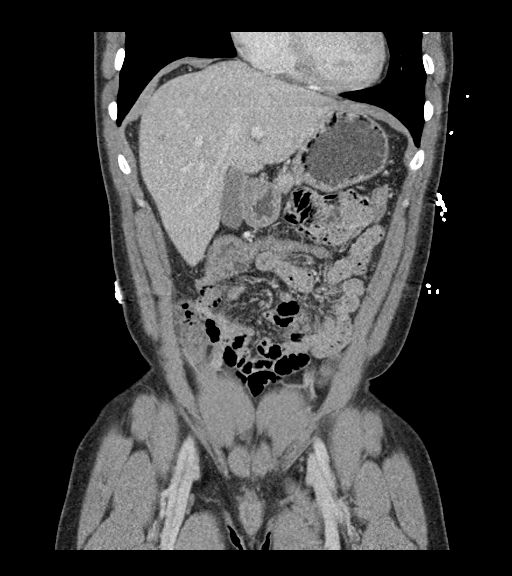
[im 36/81  soft-tissue]
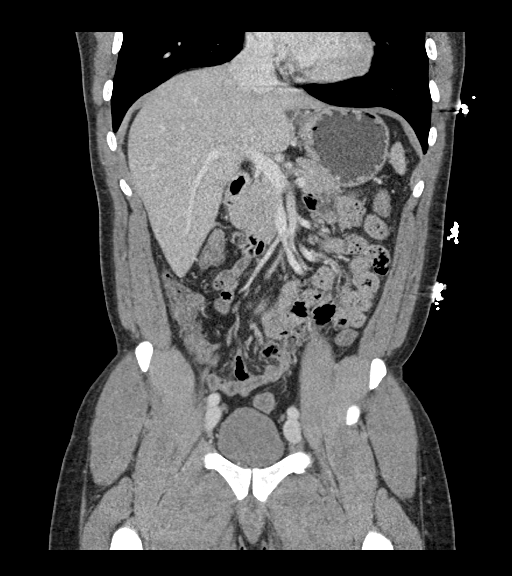
[im 45/81  soft-tissue]
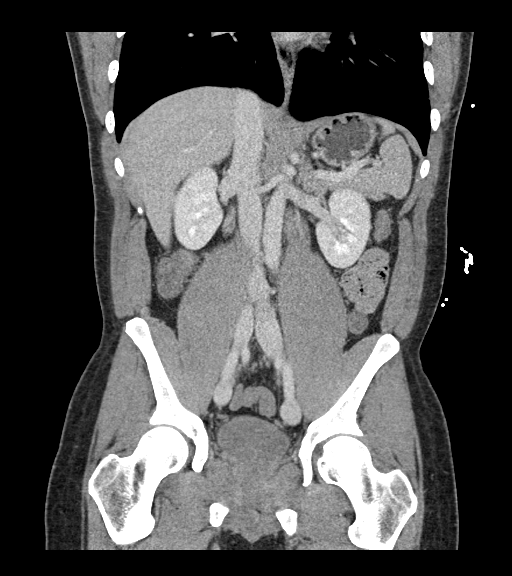

[17 of 46 positions shown; findings below may reference images not displayed]

FINDINGS: Lower chest: No acute abnormality.

Hepatobiliary: No focal liver abnormality is seen. No gallstones,
gallbladder wall thickening, or biliary dilatation.

Pancreas: Unremarkable. No pancreatic ductal dilatation or
surrounding inflammatory changes.

Spleen: Normal in size without focal abnormality.

Adrenals/Urinary Tract: Adrenal glands are unremarkable. Kidneys are
normal, without renal calculi, focal lesion, or hydronephrosis.
Bladder is unremarkable.

Stomach/Bowel: There is diffuse bowel wall thickening of the distal
ascending colon and transverse colon. There is no small bowel
obstruction. The appendix is not seen but no inflammation is noted
around cecum. The stomach is normal.

Vascular/Lymphatic: No significant vascular findings are present. No
enlarged abdominal or pelvic lymph nodes.

Reproductive: Prostate is unremarkable.

Other: Minimal umbilical herniation of mesenteric fat is identified.

Musculoskeletal: No acute abnormality.
IMPRESSION: Diffuse bowel wall thickening of the distal ascending colon and
transverse colon. This is nonspecific but can be seen in colitis,
favor infectious/inflammatory etiology.

## 2019-05-11 ENCOUNTER — Other Ambulatory Visit: Payer: Self-pay

## 2019-05-11 ENCOUNTER — Encounter (HOSPITAL_COMMUNITY): Payer: Self-pay

## 2019-05-11 ENCOUNTER — Ambulatory Visit (HOSPITAL_COMMUNITY)
Admission: EM | Admit: 2019-05-11 | Discharge: 2019-05-11 | Disposition: A | Discharge status: Home / Self Care | Payer: Self-pay | Attending: Family Medicine | Admitting: Family Medicine

## 2019-05-11 DIAGNOSIS — L02419 Cutaneous abscess of limb, unspecified: Secondary | ICD-10-CM | POA: Insufficient documentation

## 2019-05-11 DIAGNOSIS — Z7689 Persons encountering health services in other specified circumstances: Secondary | ICD-10-CM | POA: Insufficient documentation

## 2019-05-11 DIAGNOSIS — L03119 Cellulitis of unspecified part of limb: Secondary | ICD-10-CM | POA: Insufficient documentation

## 2019-05-11 MED ORDER — SULFAMETHOXAZOLE-TRIMETHOPRIM 800-160 MG PO TABS: 1.0000 | ORAL_TABLET | Freq: Two times a day (BID) | ORAL | 0 refills | Status: AC

## 2019-05-11 NOTE — ED Provider Notes (Signed)
[facility]    CSN: [id number] Arrival date & time: 05/11/19  1116      History   Chief Complaint Chief Complaint  Patient presents with  . Insect Bite    HPI Patrick Reilly is a 29 y.o. male.   HPI  Has recurring skin infections Has a recent swelling on the left knee Yesterday spontaneously drained Today is still swollen and painful  Gay  Man.  Recently found out that his BF was "stepping out on him".  Wants to be tested for "everyting" Has no penile DC or rash or symptoms   Past Medical History:  Diagnosis Date  . Bacterial sinusitis 12/10/2018  . Eczema 01/17/2018  . Flexural eczema 04/01/2017  . Headache   . HIV infection (HCC)   . HTN (hypertension) 11/01/2015  . Hypertension   . Rash 04/01/2017  . Rash, skin    abdomen; treated w cream  . Wears glasses     Patient Active Problem List   Diagnosis Date Noted  . Bacterial sinusitis 12/10/2018  . Eczema 01/17/2018  . Rash 04/01/2017  . Flexural eczema 04/01/2017  . HTN (hypertension) 11/01/2015  . Athlete's foot 03/04/2014  . Penile discharge 03/04/2014  . Genital condyloma, male 06/04/2011  . Abscess of gluteal region 06/04/2011  . Marijuana use 05/02/2011  . Depression 05/02/2011  . Alcohol use 05/02/2011  . Human immunodeficiency virus (HIV) disease (HCC) 12/21/2008    Past Surgical History:  Procedure Laterality Date  . ANAL FISTULOTOMY N/A 02/03/2016   Procedure: ANAL FISTULOTOMY;  Surgeon: Romie Levee, MD;  [facility];  Service: General;  Laterality: N/A;  . COLONOSCOPY    . RECTAL EXAM UNDER ANESTHESIA N/A 02/03/2016   Procedure: ANAL EXAM UNDER ANESTHESIA;  Surgeon: Romie Levee, MD;  Location: [facility];  Service: General;  Laterality: N/A;  . WISDOM TOOTH EXTRACTION         Home Medications    Prior to Admission medications   Medication Sig Start Date End Date Taking? Authorizing Provider  bictegravir-emtricitabine-tenofovir AF  (BIKTARVY) 50-200-25 MG TABS tablet Take 1 tablet by mouth daily. 02/23/19   Randall Hiss, MD  sulfamethoxazole-trimethoprim (BACTRIM DS) 800-160 MG tablet Take 1 tablet by mouth 2 (two) times daily for 7 days. 05/11/19 05/18/19  Eustace Moore, MD  triamcinolone ointment (KENALOG) 0.5 % Apply 1 application topically 2 (two) times daily. 02/23/19   Randall Hiss, MD    Family History History reviewed. No pertinent family history.  Social History Social History   Tobacco Use  . Smoking status: Current Some Day Smoker    Packs/day: 0.10    Types: Cigarettes  . Smokeless tobacco: Never Used  . Tobacco comment: only smokes when drinking  Substance Use Topics  . Alcohol use: Yes    Alcohol/week: 7.0 standard drinks    Types: 7 Standard drinks or equivalent per week    Comment: wine, shots, beer on weekends  . Drug use: No    Frequency: 1.0 times per week    Types: Marijuana    Comment: stopped 3 weeks     Allergies   Patient has no known allergies.   Review of Systems Review of Systems  Skin: Positive for wound.     Physical Exam Triage Vital Signs ED Triage Vitals [05/11/19 1211]  Enc Vitals Group     BP 135/80     Pulse Rate 100     Resp 16  Temp 98.2 F (36.8 C)     Temp Source Oral     SpO2 100 %     Weight 167 lb (75.8 kg)     Height      Head Circumference      Peak Flow      Pain Score 9     Pain Loc      Pain Edu?      Excl. in [place]?    No data found.  Updated Vital Signs BP 135/80 (BP Location: Right Arm)   Pulse 100   Temp 98.2 F (36.8 C) (Oral)   Resp 16   Wt 75.8 kg   SpO2 100%   BMI 26.95 kg/m       Physical Exam Constitutional:      General: He is not in acute distress.    Appearance: He is well-developed and normal weight.  HENT:     Head: Normocephalic and atraumatic.     Mouth/Throat:     Comments: Mask in place Eyes:     Conjunctiva/sclera: Conjunctivae normal.     Pupils: Pupils are equal, round, and  reactive to light.  Cardiovascular:     Rate and Rhythm: Normal rate.  Pulmonary:     Effort: Pulmonary effort is normal. No respiratory distress.  Abdominal:     General: There is no distension.     Palpations: Abdomen is soft.  Genitourinary:    Comments: Self swab described Musculoskeletal:        General: Normal range of motion.     Cervical back: Normal range of motion.  Skin:    General: Skin is warm and dry.     Comments: Knee with 4 cm induration and central opening  No fluctuance  Neurological:     Mental Status: He is alert.  Psychiatric:        Mood and Affect: Mood normal.        Behavior: Behavior normal.      UC Treatments / Results  Labs (all labs ordered are listed, but only abnormal results are displayed) Labs Reviewed  [facility]  CYTOLOGY, (ORAL, ANAL, URETHRAL) ANCILLARY ONLY    EKG   Radiology No results found.  Procedures Procedures (including critical care time)  Medications Ordered in UC Medications - No data to display  Initial Impression / Assessment and Plan / UC Course  I have reviewed the triage vital signs and the nursing notes.  Pertinent labs & imaging results that were available during my care of the patient were reviewed by me and considered in my medical decision making (see chart for details).     Abscess had drained Will treat with oral antibioitcs Test for STI Final Clinical Impressions(s) / UC Diagnoses   Final diagnoses:  Cellulitis and abscess of leg  Encounter for assessment of STD exposure     Discharge Instructions     Take the septra 2 times a day for the skin infection Warm compresses a couple times a day Check My Chart for the test results You will be called if anything is positive    ED Prescriptions    Medication Sig Dispense Auth. Provider   sulfamethoxazole-trimethoprim (BACTRIM DS) 800-160 MG tablet Take 1 tablet by mouth 2 (two) times daily for 7 days. 14 tablet Raylene Everts, MD     PDMP  not reviewed this encounter.   Raylene Everts, MD 05/11/19 1309

## 2019-05-11 NOTE — Discharge Instructions (Signed)
Take the septra 2 times a day for the skin infection Warm compresses a couple times a day Check My Chart for the test results You will be called if anything is positive

## 2019-05-11 NOTE — ED Triage Notes (Signed)
Pt states his left knee is swollen. Pt states he was bitten by a spider. X 4 days.  Pt states he needs to be tested for a STD.

## 2019-05-12 LAB — CYTOLOGY, (ORAL, ANAL, URETHRAL) ANCILLARY ONLY
Chlamydia: NEGATIVE
Comment: NEGATIVE
Comment: NEGATIVE
Comment: NORMAL
Neisseria Gonorrhea: NEGATIVE
Trichomonas: NEGATIVE

## 2019-05-12 LAB — RPR: RPR Ser Ql: NONREACTIVE

## 2019-06-17 ENCOUNTER — Encounter: Payer: Self-pay | Admitting: Infectious Disease

## 2019-06-17 ENCOUNTER — Ambulatory Visit: Payer: Self-pay

## 2019-06-17 ENCOUNTER — Other Ambulatory Visit: Payer: Self-pay

## 2019-07-14 ENCOUNTER — Telehealth: Payer: Self-pay | Admitting: *Deleted

## 2019-07-14 NOTE — Telephone Encounter (Signed)
Patient called to confirm [facility] received signed ROI from [employer] [facility] [employer] [facility].   Last office notes, labs, immunization faxed. Request placed in medical records to process remainder of chart.  Valarie Cones

## 2019-07-14 NOTE — Telephone Encounter (Signed)
Patient called for assistance transferring care to [place]. He will fax a signed medical release form. RN will send back last office note, labs, immunizations.  Will place request for medical records to process the rest of his chart. Cancelled up coming appointments, notified [facility] that patient moved. He will contact us if he decides to move back to [place]. Andree Coss, RN

## 2019-09-07 ENCOUNTER — Other Ambulatory Visit: Payer: Self-pay

## 2019-09-21 ENCOUNTER — Encounter: Payer: Self-pay | Admitting: Infectious Disease

## 2020-07-20 ENCOUNTER — Telehealth: Payer: Self-pay

## 2020-07-20 ENCOUNTER — Other Ambulatory Visit (HOSPITAL_COMMUNITY): Payer: Self-pay

## 2020-07-20 NOTE — Telephone Encounter (Signed)
[facility] Patient Advocate Encounter  Patient never came for his bottles of Biktarvy samples, I returned them.  Clearance Coots, CPhT Specialty Pharmacy Patient [facility] Phone: [phone] Fax:  [phone]
